# Patient Record
Sex: Female | Born: 1975
Health system: Southern US, Community
[De-identification: ages and names within clinical notes are randomized; demographics above are authoritative.]

## PROBLEM LIST (undated history)

## (undated) ENCOUNTER — Inpatient Hospital Stay (HOSPITAL_COMMUNITY): Payer: Self-pay

## (undated) DIAGNOSIS — R87629 Unspecified abnormal cytological findings in specimens from vagina: Secondary | ICD-10-CM

## (undated) HISTORY — PX: TUBAL LIGATION: SHX77

---

## 2003-05-10 ENCOUNTER — Inpatient Hospital Stay (HOSPITAL_COMMUNITY): Admission: AD | Admit: 2003-05-10 | Discharge: 2003-05-12 | Payer: Self-pay | Admitting: Obstetrics & Gynecology

## 2003-05-20 ENCOUNTER — Encounter: Admission: RE | Admit: 2003-05-20 | Discharge: 2003-05-20 | Payer: Self-pay | Admitting: *Deleted

## 2003-08-03 ENCOUNTER — Encounter: Admission: RE | Admit: 2003-08-03 | Discharge: 2003-08-03 | Payer: Self-pay | Admitting: *Deleted

## 2003-08-24 ENCOUNTER — Encounter: Admission: RE | Admit: 2003-08-24 | Discharge: 2003-08-24 | Payer: Self-pay | Admitting: *Deleted

## 2003-08-30 ENCOUNTER — Ambulatory Visit (HOSPITAL_COMMUNITY): Admission: RE | Admit: 2003-08-30 | Discharge: 2003-08-30 | Payer: Self-pay | Admitting: *Deleted

## 2003-09-07 ENCOUNTER — Encounter: Admission: RE | Admit: 2003-09-07 | Discharge: 2003-09-07 | Payer: Self-pay | Admitting: *Deleted

## 2003-10-12 ENCOUNTER — Inpatient Hospital Stay (HOSPITAL_COMMUNITY): Admission: AD | Admit: 2003-10-12 | Discharge: 2003-10-14 | Payer: Self-pay | Admitting: Obstetrics & Gynecology

## 2013-08-02 ENCOUNTER — Encounter (HOSPITAL_COMMUNITY): Payer: Self-pay | Admitting: *Deleted

## 2013-08-02 ENCOUNTER — Inpatient Hospital Stay (HOSPITAL_COMMUNITY): Payer: Medicaid Other

## 2013-08-02 ENCOUNTER — Inpatient Hospital Stay (HOSPITAL_COMMUNITY)
Admission: AD | Admit: 2013-08-02 | Discharge: 2013-08-02 | Disposition: A | Discharge status: Home / Self Care | Payer: Medicaid Other | Source: Ambulatory Visit | Attending: Obstetrics and Gynecology | Admitting: Obstetrics and Gynecology

## 2013-08-02 DIAGNOSIS — R109 Unspecified abdominal pain: Secondary | ICD-10-CM | POA: Insufficient documentation

## 2013-08-02 DIAGNOSIS — O209 Hemorrhage in early pregnancy, unspecified: Secondary | ICD-10-CM | POA: Insufficient documentation

## 2013-08-02 DIAGNOSIS — O200 Threatened abortion: Secondary | ICD-10-CM

## 2013-08-02 HISTORY — DX: Unspecified abnormal cytological findings in specimens from vagina: R87.629

## 2013-08-02 LAB — CBC
HCT: 34.8 % — ABNORMAL LOW (ref 36.0–46.0)
Hemoglobin: 11.9 g/dL — ABNORMAL LOW (ref 12.0–15.0)
MCH: 27.7 pg (ref 26.0–34.0)
MCHC: 34.2 g/dL (ref 30.0–36.0)
MCV: 81.1 fL (ref 78.0–100.0)
Platelets: 241 10*3/uL (ref 150–400)
RBC: 4.29 MIL/uL (ref 3.87–5.11)
RDW: 14.5 % (ref 11.5–15.5)
WBC: 8.1 10*3/uL (ref 4.0–10.5)

## 2013-08-02 LAB — URINALYSIS, ROUTINE W REFLEX MICROSCOPIC
Bilirubin Urine: NEGATIVE
Glucose, UA: NEGATIVE mg/dL
Ketones, ur: NEGATIVE mg/dL
Leukocytes, UA: NEGATIVE
Nitrite: NEGATIVE
Protein, ur: NEGATIVE mg/dL
Specific Gravity, Urine: <1.005 — ABNORMAL LOW (ref 1.005–1.030)
Urobilinogen, UA: 0.2 mg/dL (ref 0.0–1.0)
pH: 6 (ref 5.0–8.0)

## 2013-08-02 LAB — HCG, QUANTITATIVE, PREGNANCY: hCG, Beta Chain, Quant, S: 6362 m[IU]/mL — ABNORMAL HIGH (ref <5)

## 2013-08-02 LAB — URINE MICROSCOPIC-ADD ON

## 2013-08-02 LAB — WET PREP, GENITAL
Clue Cells Wet Prep HPF POC: NONE SEEN
Trich, Wet Prep: NONE SEEN
WBC, Wet Prep HPF POC: NONE SEEN
Yeast Wet Prep HPF POC: NONE SEEN

## 2013-08-02 LAB — POCT PREGNANCY, URINE: Preg Test, Ur: POSITIVE — AB

## 2013-08-02 NOTE — MAU Note (Signed)
Patient presents with complaints of intermittent vaginal bleeding for the last three days. Also having lower abdominal pain "like a period". LMP 11/28

## 2013-08-02 NOTE — MAU Provider Note (Signed)
History     CSN: [id number]  Arrival date and time: 08/02/13 2011   Chief Complaint  Patient presents with  . Vaginal Bleeding  . Abdominal Pain   HPI  38 yo G7P5105 @ [redacted]w[redacted]d by LMP (no [place] confirmation) who presents for vaginal bleeding and abdominal pain.  States has been bleeding for the last 1.5 weeks.  For the first week it was relatively light and she could wear a pantyliner but for the last 3 days it has gotten heavier. Yesterday she passed 2 small clots. Today it has just been blood.  Still not filling more than a pad a day.  Some intermittent cramping but very mild.  No fevers, chills, nausea, vomiting, dysuria, cp, sob.  +headache from time to time.    OB History   Grav Para Term Preterm Abortions TAB SAB Ect Mult Living   7 6 5 1      5       Past Medical History  Diagnosis Date  . Vaginal Pap smear, abnormal     History reviewed. No pertinent past surgical history.  History reviewed. No pertinent family history.  History  Substance Use Topics  . Smoking status: Never Smoker   . Smokeless tobacco: Not on file  . Alcohol Use: No    Allergies: No Known Allergies  Prescriptions prior to admission  Medication Sig Dispense Refill  . Prenatal Vit-Fe Fumarate-FA (MULTIVITAMIN-PRENATAL) 27-0.8 MG TABS tablet Take 1 tablet by mouth daily at 12 noon.        Review of Systems  All other systems reviewed and are negative.   Physical Exam   Blood pressure 138/56, pulse 89, temperature 99 F (37.2 C), temperature source Oral, resp. rate 18, height 4' 11.5" (1.511 m), weight 67.132 kg (148 lb), last menstrual period 05/15/2013, SpO2 100.00%.  Physical Exam  Constitutional: She appears well-developed and well-nourished.  HENT:  Head: Normocephalic and atraumatic.  Neck: Neck supple.  Cardiovascular: Normal rate, regular rhythm and normal heart sounds.   Respiratory: Effort normal and breath sounds normal.  GI: Soft. Bowel sounds are normal. She exhibits no  distension. There is no tenderness. There is no rebound and no guarding.  Genitourinary: There is no lesion on the right labia. There is no lesion on the left labia. Uterus is enlarged (approx 5-6week size). Uterus is not tender. Cervix exhibits no motion tenderness. Right adnexum displays no mass and no tenderness. Left adnexum displays no mass and no tenderness. There is bleeding (mild amount of dark red blood in the posterior fornix able to absorm most with a single faux swab. no active vleeding from the OS. OS appears closed.  ) around the vagina.  Musculoskeletal: She exhibits no edema.  Neurological: She is alert.  Skin: Skin is warm and dry.  Psychiatric: She has a normal mood and affect.    MAU Course  Procedures  MDM UPT + CBC- ABO/Rh: A+  HCG- 6362  [place] Large gestational sac with no yolk sac or embryo. Dates are  discordant. Findings are suspicious but not yet definitive for  failed pregnancy. Recommend follow-up US in 10-14 days for  definitive diagnosis.   Assessment and Plan   Exam and [place] most consistent with likely spontaneous abortion - results discussed with pt - A+ blood type - will have her f/u in 2 days for repeat quant given pt's disbelief in diagnosis - then likely follow weekly until resolved - consider [place] as needed  Return precautions discussed   Jeannie Mallinger,  Rosevelt Luu L 08/02/2013, 8:54 PM

## 2013-08-02 NOTE — Discharge Instructions (Signed)
Amenaza de aborto  (Threatened Miscarriage)  La hemorragia en las primeras 20 semanas de embarazo es algo frecuente. Se denomina amenaza de aborto Es un problema del embarazo que ocurre antes de la vigésima semana y que sugiere la probabilidad de que ocurra un aborto espontáneo. Generalmente esta hemorragia se detiene con reposo o con disminución de las actividades, según le ha sugerido el profesional que la asiste, y el embarazo continúa sin mayores problemas. Le indicarán que no mantenga relaciones sexuales, no tenga orgasmos ni use tampones hasta que la autoricen. En algunos casos la amenaza de aborto progresará hasta el aborto completo o incompleto. En algunos casos puede ser necesario un tratamiento adicional, en otros casos no. Algunos abortos ocurren antes que la mujer note que no ha menstruado y de que sepa que está embarazada.  Un aborto ocurre en el 15% al 20% de todos los embarazos y generalmente durante las primeras 13 semanas. En la mayoría de los casos se desconoce la causa exacta. Es una manera en que la naturaliza pone fin a un embarazo anormal o que no llegará a término. Algunas de las cosas que ponen en riesgo el embarazo son:  · Los cambios hormonales.  · Infección o tumores en el útero.  · Enfermedades crónicas, por ejemplo la diabetes, especialmente si no se ha controlado.  · Forma anormal del útero.  · Fibromas en el útero  · Cérvix incompetente (es demasiado débil para contener al bebé)  · El consumo de cigarrillos.  · Beber alcohol en exceso. Lo mejor es abstenerse de beber alcohol durante el embarazo.  · El consumo de drogas  TRATAMIENTO  No es necesario realizar un tratamiento adicional cuando el aborto es completo y todos los componentes de la concepción (todos los tejidos) se han eliminado. Si ha eliminado tejidos, consérvelos en un recipiente y llévelos al médico para que los evalúe. Si el aborto es incompleto (partes del feto o de la placenta quedan adentro) será necesario un  tratamiento adicional. La razón más frecuente para realizar un tratamiento es el sangrado (hemorragia) continuo debido a que los tejidos no se han eliminado completamente. Esto sucede cuando el aborto es incompleto. También puede suceder que los tejidos que no se han expulsado se infecten. El tratamiento consiste en la dilatación y curetaje (remoción de los productos del embarazo que pudieran quedar en el útero). Se realizará simplemente por medio de la succión (curetaje por succión) o por un raspado simple del interior del útero. Podrá llevarse a cabo en el hospital o en el consultorio del profesional. Sólo se lleva a cabo cuando el profesional se asegura que no hay posibilidades de que el embarazo llegue a término. Esto se determina por medio del examen físico, la prueba de embarazo negativa, el recuento hormonal y el ultrasonido que confirme la muerte del feto.  El aborto generalmente es una situación emocionalmente difícil para los padres. No es por su culpa ni la de su pareja. No ocurre por conductas inapropiadas por parte suya o de su compañero. Casi todos los abortos se producen porque el embarazo ha comenzado de un modo incorrecto. Al menos la mitad de estos embarazos presenta anormalidades cromosómicas. Casi nunca se trata de un trastorno congénito. En otros puede haber problemas de desarrollo en el feto o en la placenta. Esto no siempre se revela, aún cuando se estudien los productos del aborto con el microscopio. No se sienta culpable y probablemente no podría haber evitado que esto ocurra. Si tiene problemas emocionales debido a   este problema, convérselo con el profesional y solicite ayuda psicológico antes de un nuevo embarazo. Casi siempre puede tratar de embarazarse nuevamente tan pronto el profesional la autorice.  INSTRUCCIONES PARA EL CUIDADO DOMICILIARIO  · El profesional le indicará reposo, según la importancia de la hemorragia y los dolores que presente. Probablemente sólo la autorice a  levantarse para ir al baño. También podrá autorizarla a realizar una actividad ligera. En este momento usted necesitará hacer algunos arreglos para que otra persona se ocupe del cuidado de los niños y de otras responsabilidades adicionales.  · Lleve un registro de la cantidad y la saturación de las toallas higiénicas que utiliza cada día. Anote esta información.  · NO USE TAMPONES. No se haga duchas vaginales ni tenga relaciones sexuales u orgasmos hasta que el médico la autorice.  · Puede ser que le indiquen una cita para un seguimiento en el que volverán a evaluar el estado de su embarazo y le repetirán las pruebas de sangre. Concurra para una nueva evaluación dentro de 2 días y nuevamente dentro de 4 a 6 semanas. Es muy importante que realice el seguimiento en el momento que le han indicado.  · Si su grupo sanguíneo es Rh negativo y el del padre es Rh positivo, o si no conoce el grupo sanguíneo del padre, le indicarán una inyección (inmunoglobulina Rh) para prevenir los anticuerpos anormales que pueden desarrollarse y afectar al bebé en futuros embarazos.  SOLICITE ATENCIÓN MÉDICA DE INMEDIATO SI:  · Siente calambres intensos en el estómago, en la espalda o en el abdomen.  · Siente un dolor intenso de comienzo súbito en la zona inferior del abdomen.  · Comienza a sentir escalofríos.  · La temperatura se eleva por encima de 101° F (38.3° C).  · Elimina coágulos o tejidos grandes. Guarde una muestra de esos tejidos para que el profesional lo inspeccione.  · La hemorragia aumenta o se siente mareada, débil o tiene episodios de desmayos.  · Tiene una pérdida de líquido por la vagina.  · Se desmaya. No puede mover el intestino. Podría tratarse de un embarazo ectópico.  Document Released: 03/14/2005 Document Revised: 08/27/2011  ExitCare® Patient Information ©2014 [employer].

## 2013-08-03 LAB — GC/CHLAMYDIA PROBE AMP
CT Probe RNA: NEGATIVE
GC Probe RNA: NEGATIVE

## 2013-08-03 LAB — ABO/RH: ABO/RH(D): A POS

## 2013-08-03 NOTE — MAU Provider Note (Signed)
Attestation of Attending Supervision of Advanced Practitioner (CNM/NP): Evaluation and management procedures were performed by the Advanced Practitioner under my supervision and collaboration.  I have reviewed the Advanced Practitioner's note and chart, and I agree with the management and plan.  Hailley Byers 08/03/2013 7:54 AM   

## 2013-08-04 ENCOUNTER — Inpatient Hospital Stay (HOSPITAL_COMMUNITY)
Admission: AD | Admit: 2013-08-04 | Discharge: 2013-08-04 | Disposition: A | Discharge status: Home / Self Care | Payer: Self-pay | Source: Ambulatory Visit | Attending: Obstetrics & Gynecology | Admitting: Obstetrics & Gynecology

## 2013-08-04 DIAGNOSIS — O039 Complete or unspecified spontaneous abortion without complication: Secondary | ICD-10-CM | POA: Insufficient documentation

## 2013-08-04 LAB — HCG, QUANTITATIVE, PREGNANCY: hCG, Beta Chain, Quant, S: 5405 m[IU]/mL — ABNORMAL HIGH (ref <5)

## 2013-08-04 MED ORDER — HYDROCODONE-ACETAMINOPHEN 5-325 MG PO TABS: 1.0000 | ORAL_TABLET | Freq: Four times a day (QID) | ORAL | Status: DC | PRN

## 2013-08-04 NOTE — MAU Note (Signed)
Here for follow up BHCG, some bleeding this am, no pain

## 2013-08-04 NOTE — MAU Provider Note (Signed)
  History     CSN: [id number]  Arrival date and time: 08/04/13 1845   None     Chief Complaint  Patient presents with  . Follow-up   HPI  Jillian Carlson is a 38 y.o. [id number] at 7860w4d who presents today for FU HCG. She states that she has had some bleeding, but no pain at this time. US 2 days ago showed gestational sac, but no yolk sac or fetal pole.   Past Medical History  Diagnosis Date  . Vaginal Pap smear, abnormal     No past surgical history on file.  No family history on file.  History  Substance Use Topics  . Smoking status: Never Smoker   . Smokeless tobacco: Not on file  . Alcohol Use: No    Allergies: No Known Allergies  Prescriptions prior to admission  Medication Sig Dispense Refill  . Prenatal Vit-Fe Fumarate-FA (MULTIVITAMIN-PRENATAL) 27-0.8 MG TABS tablet Take 1 tablet by mouth daily at 12 noon.        ROS Physical Exam   Blood pressure 105/52, pulse 63, temperature 99 F (37.2 C), resp. rate 16, height 5' 0.5" (1.537 m), weight 66.679 kg (147 lb), last menstrual period 05/15/2013, SpO2 100.00%.  Physical Exam  Nursing note and vitals reviewed. Constitutional: She is oriented to person, place, and time. She appears well-developed and well-nourished. No distress.  Cardiovascular: Normal rate.   Respiratory: Effort normal.  GI: Soft.  Neurological: She is alert and oriented to person, place, and time.  Skin: Skin is warm and dry.  Psychiatric: She has a normal mood and affect.    MAU Course  Procedures  Results for Jillian Carlson, Jillian Carlson (MRN [id number]) as of 08/04/2013 19:46  Ref. Range 08/02/2013 20:45 08/02/2013 21:12 08/02/2013 22:04 08/04/2013 19:12  hCG, Beta Chain, Quant, S Latest Range: <5 mIU/mL 6362 (H)   5405 (H)    Assessment and Plan   1. SAB (spontaneous abortion)    Bleeding precautions Return to MAU as needed Repeat blood work in 2 days in the clinic  Follow-up Information   [facility]  [facility] days.   Specialty:  Obstetrics and Gynecology   Contact information:   [address] [place] [address] [phone]       Tawnya CrookHogan, Lu Paradise Donovan 08/04/2013, 7:46 PM

## 2013-08-04 NOTE — MAU Provider Note (Signed)
Attestation of Attending Supervision of Advanced Practitioner (CNM/NP): Evaluation and management procedures were performed by the Advanced Practitioner under my supervision and collaboration. I have reviewed the Advanced Practitioner's note and chart, and I agree with the management and plan.  Angelys Yetman H. 9:18 PM

## 2013-08-04 NOTE — Discharge Instructions (Signed)
Aborto espontáneo  °(Miscarriage) °El aborto espontáneo es la pérdida de un bebé que no ha nacido (feto) antes de la semana 20 del embarazo. La mayor parte de estos abortos ocurre en los primeros 3 meses. En algunos casos ocurre antes de que la mujer sepa que está embarazada. También se denomina "aborto espontáneo" o "pérdida prematura del embarazo". El aborto espontáneo puede ser una experiencia que afecte emocionalmente a la persona. Converse con su médico si tiene dudas, cómo es el proceso de duelo, y sobre planes futuros de embarazo.  °CAUSAS  °· Algunos problemas cromosómicos pueden hacer imposible que el bebé se desarrolle normalmente. Los problemas con los genes o cromosomas del bebé son generalmente el resultado de errores que se producen, por casualidad, cuando el embrión se divide y crece. Estos problemas no se heredan de los padres. °· Infección en el cuello del útero.   °· Problemas hormonales.   °· Problemas en el cuello del útero, como tener un útero incompetente. Esto ocurre cuando los tejidos no son lo suficientemente fuertes como para contener el embarazo.   °· Problemas del útero, como un útero con forma anormal, los fibromas o anormalidades congénitas.   °· Ciertas enfermedades crónicas.   °· No fume, no beba alcohol, ni consuma drogas.   °· Traumatismos   °A veces, la causa es desconocida.  °SÍNTOMAS  °· Sangrado o manchado vaginal, con o sin cólicos o dolor. °· Dolor o cólicos en el abdomen o en la cintura. °· Eliminación de líquido, tejidos o coágulos grandes por la vagina. °DIAGNÓSTICO  °El médico le hará un examen físico. También le indicará una ecografía para confirmar el aborto. Es posible que se realicen análisis de sangre.  °TRATAMIENTO  °· En algunos casos el tratamiento no es necesario, si se eliminan naturalmente todos los tejidos embrionarios que se encontraban en el útero. Si el feto o la placenta quedan dentro del útero (aborto incompleto), pueden infectarse, los tejidos que quedan  pueden infectarse y deben retirarse. Generalmente se realiza un procedimiento de dilatación y curetaje (D y C). Durante el procedimiento de dilatación y curetaje, el cuello del útero se abre (dilata) y se retira cualquier resto de tejido fetal o placentario del útero. °· Si hay una infección, le recetarán antibióticos. Podrán recetarle otros medicamentos para reducir el tamaño del útero (contraerlo) si hay una mucho sangrado. °· Si su sangre es Rh negativa y su bebé es Rh positivo, usted necesitará la inyección de inmunoglobulina Rh. Esta inyección protegerá a los futuros bebés de tener problemas de compatibilidad Rh en futuros embarazos. °INSTRUCCIONES PARA EL CUIDADO EN EL HOGAR  °· El médico le indicará reposo en cama o le permitirá realizar actividades livianas. Vuelva a la actividad lentamente o según las indicaciones de su médico. °· Pídale a alguien que la ayude con las responsabilidades familiares y del hogar durante este tiempo.   °· Lleve un registro de la cantidad y la saturación de las toallas higiénicas que utiliza cada día. Anote esta información   °· No use tampones. No No se haga duchas vaginales ni tenga relaciones sexuales hasta que el médico la autorice.   °· Sólo tome medicamentos de venta libre o recetados para calmar el dolor o el malestar, según las indicaciones de su médico.   °· No tome aspirina. La aspirina puede ocasionar hemorragias.   °· Concurra puntualmente a las citas de control con el médico.   °· Si usted o su pareja tienen dificultades con el duelo, hable con su médico para buscar la ayuda psicológica que los ayude a enfrentar la pérdida   del embarazo. Permítase el tiempo suficiente de duelo antes de quedar embarazada nuevamente.   °SOLICITE ATENCIÓN MÉDICA DE INMEDIATO SI:  °· Siente calambres intensos o dolor en la espalda o en el abdomen. °· Tiene fiebre. °· Elimina grandes coágulos de sangre (del tamaño de una nuez o más) o tejidos por la vagina. Guarde lo que ha eliminado para  que su médico lo examine.   °· La hemorragia aumenta.   °· Observa una secreción vaginal espesa y con mal olor. °· Se siente mareada, débil, o se desmaya.   °· Siente escalofríos.   °ASEGÚRESE DE QUE:  °· Comprende estas instrucciones. °· Controlará su enfermedad. °· Solicitará ayuda de inmediato si no mejora o si empeora. °Document Released: 03/14/2005 Document Revised: 09/29/2012 °ExitCare® Patient Information ©2014 [employer]. ° °

## 2013-08-06 ENCOUNTER — Other Ambulatory Visit: Payer: Medicaid Other

## 2013-08-06 ENCOUNTER — Other Ambulatory Visit: Payer: Self-pay | Admitting: Obstetrics & Gynecology

## 2013-08-06 DIAGNOSIS — O039 Complete or unspecified spontaneous abortion without complication: Secondary | ICD-10-CM

## 2013-08-06 DIAGNOSIS — O200 Threatened abortion: Secondary | ICD-10-CM

## 2013-08-06 LAB — HCG, QUANTITATIVE, PREGNANCY: hCG, Beta Chain, Quant, S: 5687 m[IU]/mL

## 2013-08-06 NOTE — Progress Notes (Signed)
Beta hcg is not rising appropriately.  Gestational sac was 24 mm last scan.  Most likely a failed pregnancy.  Will scan 2 weeks after last scan to get diagnosis.  Pt needs US scheduled and called with appt.

## 2013-08-07 NOTE — Addendum Note (Signed)
Addended by: Sherre LainASH, AMANDA A on: 08/07/2013 08:51 AM   Modules accepted: Orders

## 2013-08-07 NOTE — Progress Notes (Signed)
Ultrasound scheduled for 08/17/13 at 1130. Patient informed.

## 2013-08-17 ENCOUNTER — Encounter: Payer: Self-pay | Admitting: Obstetrics & Gynecology

## 2013-08-17 ENCOUNTER — Other Ambulatory Visit: Payer: Self-pay | Admitting: Obstetrics & Gynecology

## 2013-08-17 ENCOUNTER — Ambulatory Visit (HOSPITAL_COMMUNITY)
Admission: RE | Admit: 2013-08-17 | Discharge: 2013-08-17 | Disposition: A | Discharge status: Home / Self Care | Payer: Medicaid Other | Source: Ambulatory Visit | Attending: Obstetrics & Gynecology | Admitting: Obstetrics & Gynecology

## 2013-08-17 DIAGNOSIS — O208 Other hemorrhage in early pregnancy: Secondary | ICD-10-CM | POA: Insufficient documentation

## 2013-08-17 DIAGNOSIS — O200 Threatened abortion: Secondary | ICD-10-CM

## 2013-08-17 NOTE — Progress Notes (Unsigned)
Patient ID: Jillian PassySusana Carlson, female   DOB: 05/17/1976, 38 y.o.   MRN: [id number] Pt was seen in radiology today for evaluation of vaginal bleeding in pregnancy.  She was noted ot have a blighted ovum at 13 weeks.  She reports continued bleeding today which is not heavy. Pt ate cereal and coffee 1 /12- 2 hours prior to our discussion.  She denies current pain.  Counseled pt about treatment options including the fact that she could bleed extensively and have lots of pain at this GA if she opted for medical management.   Patient desires surgical management with dilation and curettage for blighted ovum at 13 weeks.   The risks of surgery were discussed in detail with the patient including but not limited to: bleeding which may require transfusion or reoperation; infection which may require prolonged hospitalization or re-hospitalization and antibiotic therapy; injury to bowel, bladder, ureters and major vessels or other surrounding organs; need for additional procedures including laparotomy; thromboembolic phenomenon, incisional problems and other postoperative or anesthesia complications.  Patient was told that the likelihood that her condition and symptoms will be treated effectively with this surgical management was very high; the postoperative expectations were also discussed in detail. The patient also understands the alternative treatment options which were discussed in full. All questions were answered.  Scheduled for 08/18/2013 at 1000. Pt instructed to arrive at 0830 NPO.  Entire visit was with the use of Jillian Carlson the spanish interpreter.  Jillian Carlson L. Harraway-Smith, M.D., Evern CoreFACOG

## 2013-08-18 ENCOUNTER — Encounter (HOSPITAL_COMMUNITY)
Admission: AD | Disposition: A | Discharge status: Home / Self Care | Payer: Self-pay | Source: Ambulatory Visit | Attending: Obstetrics and Gynecology

## 2013-08-18 ENCOUNTER — Encounter (HOSPITAL_COMMUNITY): Payer: Self-pay | Admitting: Certified Registered Nurse Anesthetist

## 2013-08-18 ENCOUNTER — Ambulatory Visit (HOSPITAL_COMMUNITY)
Admission: AD | Admit: 2013-08-18 | Discharge: 2013-08-18 | Disposition: A | Discharge status: Home / Self Care | Payer: Self-pay | Source: Ambulatory Visit | Attending: Obstetrics and Gynecology | Admitting: Obstetrics and Gynecology

## 2013-08-18 ENCOUNTER — Ambulatory Visit (HOSPITAL_COMMUNITY): Payer: Medicaid Other | Admitting: Certified Registered Nurse Anesthetist

## 2013-08-18 ENCOUNTER — Encounter (HOSPITAL_COMMUNITY): Payer: Self-pay | Admitting: *Deleted

## 2013-08-18 DIAGNOSIS — O021 Missed abortion: Secondary | ICD-10-CM | POA: Insufficient documentation

## 2013-08-18 HISTORY — PX: DILATION AND EVACUATION: SHX1459

## 2013-08-18 LAB — CBC
HCT: 36.4 % (ref 36.0–46.0)
Hemoglobin: 12.4 g/dL (ref 12.0–15.0)
MCH: 27.9 pg (ref 26.0–34.0)
MCHC: 34.1 g/dL (ref 30.0–36.0)
MCV: 81.8 fL (ref 78.0–100.0)
Platelets: 196 10*3/uL (ref 150–400)
RBC: 4.45 MIL/uL (ref 3.87–5.11)
RDW: 14.5 % (ref 11.5–15.5)
WBC: 5.8 10*3/uL (ref 4.0–10.5)

## 2013-08-18 SURGERY — DILATION AND EVACUATION, UTERUS
Anesthesia: Monitor Anesthesia Care | Site: Uterus

## 2013-08-18 MED ORDER — LACTATED RINGERS IV SOLN
INTRAVENOUS | Status: DC
Start: 2013-08-18 — End: 2013-08-18
  Administered 2013-08-18: 09:00:00 via INTRAVENOUS

## 2013-08-18 MED ORDER — ACETAMINOPHEN 160 MG/5ML PO SOLN: 325.0000 mg | ORAL | Status: DC | PRN

## 2013-08-18 MED ORDER — DEXAMETHASONE SODIUM PHOSPHATE 10 MG/ML IJ SOLN
INTRAMUSCULAR | Status: DC | PRN
  Administered 2013-08-18: 5 mg via INTRAVENOUS

## 2013-08-18 MED ORDER — LIDOCAINE HCL (CARDIAC) 20 MG/ML IV SOLN
INTRAVENOUS | Status: AC
  Filled 2013-08-18: qty 5

## 2013-08-18 MED ORDER — MIDAZOLAM HCL 2 MG/2ML IJ SOLN
INTRAMUSCULAR | Status: AC
  Filled 2013-08-18: qty 2

## 2013-08-18 MED ORDER — FENTANYL CITRATE 0.05 MG/ML IJ SOLN
INTRAMUSCULAR | Status: AC
  Filled 2013-08-18: qty 2

## 2013-08-18 MED ORDER — ACETAMINOPHEN 325 MG PO TABS
325.0000 mg | ORAL_TABLET | ORAL | Status: DC | PRN
  Administered 2013-08-18: 650 mg via ORAL

## 2013-08-18 MED ORDER — FENTANYL CITRATE 0.05 MG/ML IJ SOLN
INTRAMUSCULAR | Status: DC | PRN
  Administered 2013-08-18: 100 ug via INTRAVENOUS

## 2013-08-18 MED ORDER — CHLOROPROCAINE HCL 1 % IJ SOLN
INTRAMUSCULAR | Status: AC
  Filled 2013-08-18: qty 30

## 2013-08-18 MED ORDER — LIDOCAINE HCL (CARDIAC) 20 MG/ML IV SOLN
INTRAVENOUS | Status: DC | PRN
  Administered 2013-08-18: 40 mg via INTRAVENOUS

## 2013-08-18 MED ORDER — IBUPROFEN 600 MG PO TABS: 600.0000 mg | ORAL_TABLET | Freq: Four times a day (QID) | ORAL | Status: DC | PRN

## 2013-08-18 MED ORDER — KETOROLAC TROMETHAMINE 30 MG/ML IJ SOLN
INTRAMUSCULAR | Status: DC | PRN
  Administered 2013-08-18: 30 mg via INTRAVENOUS

## 2013-08-18 MED ORDER — ONDANSETRON HCL 4 MG/2ML IJ SOLN
INTRAMUSCULAR | Status: DC | PRN
  Administered 2013-08-18: 4 mg via INTRAVENOUS

## 2013-08-18 MED ORDER — MIDAZOLAM HCL 5 MG/5ML IJ SOLN
INTRAMUSCULAR | Status: DC | PRN
  Administered 2013-08-18: 2 mg via INTRAVENOUS

## 2013-08-18 MED ORDER — PROPOFOL 10 MG/ML IV EMUL
INTRAVENOUS | Status: AC
  Filled 2013-08-18: qty 20

## 2013-08-18 MED ORDER — CHLOROPROCAINE HCL 1 % IJ SOLN
INTRAMUSCULAR | Status: DC | PRN
  Administered 2013-08-18: 10 mL

## 2013-08-18 MED ORDER — PROPOFOL INFUSION 10 MG/ML OPTIME
INTRAVENOUS | Status: DC | PRN
  Administered 2013-08-18: 150 ug/kg/min via INTRAVENOUS

## 2013-08-18 MED ORDER — DEXAMETHASONE SODIUM PHOSPHATE 10 MG/ML IJ SOLN
INTRAMUSCULAR | Status: AC
  Filled 2013-08-18: qty 1

## 2013-08-18 MED ORDER — GLYCOPYRROLATE 0.2 MG/ML IJ SOLN
INTRAMUSCULAR | Status: DC | PRN
  Administered 2013-08-18: 0.2 mg via INTRAVENOUS

## 2013-08-18 MED ORDER — OXYCODONE-ACETAMINOPHEN 5-325 MG PO TABS: 1.0000 | ORAL_TABLET | ORAL | Status: DC | PRN

## 2013-08-18 MED ORDER — ACETAMINOPHEN 325 MG PO TABS
ORAL_TABLET | ORAL | Status: AC
  Administered 2013-08-18: 650 mg via ORAL
  Filled 2013-08-18: qty 2

## 2013-08-18 MED ORDER — GLYCOPYRROLATE 0.2 MG/ML IJ SOLN
INTRAMUSCULAR | Status: AC
  Filled 2013-08-18: qty 1

## 2013-08-18 MED ORDER — FENTANYL CITRATE 0.05 MG/ML IJ SOLN: 25.0000 ug | INTRAMUSCULAR | Status: DC | PRN

## 2013-08-18 MED ORDER — ONDANSETRON HCL 4 MG/2ML IJ SOLN
INTRAMUSCULAR | Status: AC
  Filled 2013-08-18: qty 2

## 2013-08-18 SURGICAL SUPPLY — 19 items
CATH ROBINSON RED A/P 16FR (CATHETERS) ×3 IMPLANT
DECANTER SPIKE VIAL GLASS SM (MISCELLANEOUS) ×3 IMPLANT
GLOVE BIOGEL PI IND STRL 6.5 (GLOVE) ×4 IMPLANT
GLOVE BIOGEL PI INDICATOR 6.5 (GLOVE) ×8
GLOVE ECLIPSE 6.0 STRL STRAW (GLOVE) ×6 IMPLANT
GLOVE SURG SS PI 6.0 STRL IVOR (GLOVE) ×3 IMPLANT
GLOVE SURG SS PI 7.0 STRL IVOR (GLOVE) ×9 IMPLANT
GOWN STRL REUS W/TWL LRG LVL3 (GOWN DISPOSABLE) ×9 IMPLANT
KIT BERKELEY 1ST TRIMESTER 3/8 (MISCELLANEOUS) ×3 IMPLANT
NEEDLE SPNL 22GX3.5 QUINCKE BK (NEEDLE) ×3 IMPLANT
NS IRRIG 1000ML POUR BTL (IV SOLUTION) ×3 IMPLANT
PACK VAGINAL MINOR WOMEN LF (CUSTOM PROCEDURE TRAY) ×3 IMPLANT
PAD OB MATERNITY 4.3X12.25 (PERSONAL CARE ITEMS) ×3 IMPLANT
PAD PREP 24X48 CUFFED NSTRL (MISCELLANEOUS) ×3 IMPLANT
SET BERKELEY SUCTION TUBING (SUCTIONS) ×3 IMPLANT
SLEEVE SURGEON STRL (DRAPES) ×3 IMPLANT
SYR CONTROL 10ML LL (SYRINGE) ×3 IMPLANT
TOWEL OR 17X24 6PK STRL BLUE (TOWEL DISPOSABLE) ×6 IMPLANT
VACURETTE 10 RIGID CVD (CANNULA) ×3 IMPLANT

## 2013-08-18 NOTE — H&P (Addendum)
Jillian Carlson is an 38 y.o. female G7P5105 at 13 weeks with blighted ovum presenting today for scheduled dilatation and evacuation. Patient reports some conitnued spotting but no cramping.  Pertinent Gynecological History: Menses: flow is moderate Contraception: none DES exposure: denies Blood transfusions: none Sexually transmitted diseases: no past history    Menstrual History:  Patient's last menstrual period was 05/15/2013.    Past Medical History  Diagnosis Date  . Vaginal Pap smear, abnormal     No past surgical history on file.  No family history on file.  Social History:  reports that she has never smoked. She does not have any smokeless tobacco history on file. She reports that she does not drink alcohol or use illicit drugs.  Allergies: No Known Allergies  Prescriptions prior to admission  Medication Sig Dispense Refill  . Prenatal Vit-Fe Fumarate-FA (MULTIVITAMIN-PRENATAL) 27-0.8 MG TABS tablet Take 1 tablet by mouth daily at 12 noon.        Review of Systems  All other systems reviewed and are negative.    Blood pressure 117/51, pulse 57, temperature 98.8 F (37.1 C), temperature source Oral, resp. rate 16, last menstrual period 05/15/2013, SpO2 100.00%. Physical Exam  GENERAL: Well-developed, well-nourished female in no acute distress.  HEENT: Normocephalic, atraumatic. Sclerae anicteric.  NECK: Supple. Normal thyroid.  LUNGS: Clear to auscultation bilaterally.  HEART: Regular rate and rhythm. ABDOMEN: Soft, nontender, nondistended. No organomegaly. PELVIC: Deferred to OR EXTREMITIES: No cyanosis, clubbing, or edema, 2+ distal pulses.  Results for orders placed during the hospital encounter of 08/18/13 (from the past 24 hour(s))  CBC     Status: None   Collection Time    08/18/13  8:55 AM      Result Value Ref Range   WBC 5.8  4.0 - 10.5 K/uL   RBC 4.45  3.87 - 5.11 MIL/uL   Hemoglobin 12.4  12.0 - 15.0 g/dL   HCT 40.936.4  81.136.0 - 91.446.0 %   MCV 81.8  78.0 - 100.0 fL   MCH 27.9  26.0 - 34.0 pg   MCHC 34.1  30.0 - 36.0 g/dL   RDW 78.214.5  95.611.5 - 21.315.5 %   Platelets 196  150 - 400 K/uL    [place] Ob Transvaginal  08/17/2013   CLINICAL DATA:  Vaginal bleeding.  EXAM: TRANSVAGINAL OB ULTRASOUND  TECHNIQUE: Transvaginal ultrasound was performed for complete evaluation of the gestation as well as the maternal uterus, adnexal regions, and pelvic cul-de-sac.  COMPARISON:  08/02/2013  FINDINGS: Intrauterine gestational sac: Visualized/ irregular in shape.  Yolk sac:  Not visualized  Embryo:  Not visualized  MSD: 31  mm   8 w   3  d  Maternal uterus/adnexae: Small to moderate subchorionic hemorrhage noted. Neither ovary directly visualized, however no adnexal mass identified. No evidence of free fluid.  IMPRESSION: Findings now meet definitive criteria for failed pregnancy. This follows SRU consensus guidelines: Diagnostic Criteria for Nonviable Pregnancy Early in the First Trimester. Macy Mis Engl J Med [phone];369:1443-51.   Electronically Signed   By: Myles RosenthalJohn  Stahl M.D.   On: 08/17/2013 12:24    Assessment/Plan: 38 yo G7P5105 with failed pregnancy here for surgical management with dilatation and evacuation - Risks, benefits and alternatives were explained including but not limited to risks of bleeding, infection, uterine perforation and damage to adjacent organs. Patient verbalized understanding and all questions were answered. Patient was counseled in the presence of a spanish interpreter, Eda Royal.  Rande Dario 08/18/2013, 9:22 AM

## 2013-08-18 NOTE — Transfer of Care (Signed)
Immediate Anesthesia Transfer of Care Note  Patient: Jillian Carlson  Procedure(s) Performed: Procedure(s): DILATATION AND EVACUATION (N/A)  Patient Location: PACU  Anesthesia Type:MAC  Level of Consciousness: awake, alert  and oriented  Airway & Oxygen Therapy: Patient Spontanous Breathing and Patient connected to nasal cannula oxygen  Post-op Assessment: Report given to PACU RN, Post -op Vital signs reviewed and stable and Patient moving all extremities X 4  Post vital signs: Reviewed and stable  Complications: No apparent anesthesia complications

## 2013-08-18 NOTE — Preoperative (Signed)
Beta Blockers   Reason not to administer Beta Blockers:Not Applicable 

## 2013-08-18 NOTE — Anesthesia Preprocedure Evaluation (Signed)
Anesthesia Evaluation  Patient identified by MRN, date of birth, ID band Patient awake    Reviewed: Allergy & Precautions, H&P , Patient's Chart, lab work & pertinent test results, reviewed documented beta blocker date and time   Airway Mallampati: II TM Distance: >3 FB Neck ROM: full    Dental no notable dental hx.    Pulmonary  breath sounds clear to auscultation  Pulmonary exam normal       Cardiovascular Rhythm:regular Rate:Normal     Neuro/Psych    GI/Hepatic   Endo/Other    Renal/GU      Musculoskeletal   Abdominal   Peds  Hematology   Anesthesia Other Findings   Reproductive/Obstetrics                           Anesthesia Physical Anesthesia Plan  ASA: II  Anesthesia Plan: MAC   Post-op Pain Management:    Induction: Intravenous  Airway Management Planned: LMA, Mask and Natural Airway  Additional Equipment:   Intra-op Plan:   Post-operative Plan:   Informed Consent: I have reviewed the patients History and Physical, chart, labs and discussed the procedure including the risks, benefits and alternatives for the proposed anesthesia with the patient or authorized representative who has indicated his/her understanding and acceptance.   Dental Advisory Given  Plan Discussed with: CRNA and Surgeon  Anesthesia Plan Comments: (Discussed sedation and potential to need to place airway or ETT if warranted by clinical changes intra-operatively. We will start procedure as MAC.)        Anesthesia Quick Evaluation  

## 2013-08-18 NOTE — Discharge Instructions (Signed)
Dilatación y curetaje o curetaje por aspiración - Cuidados posteriores  (Dilation and Curettage or Vacuum Curettage, Care After)  Siga estas instrucciones durante las próximas semanas. Estas indicaciones le proporcionan información general acerca de cómo deberá cuidarse después del procedimiento. El médico también podrá darle instrucciones más específicas. El tratamiento se ha planificado de acuerdo a las prácticas médicas actuales, pero a veces se producen problemas. Comuníquese con el médico si tiene algún problema o tiene dudas después del procedimiento.  QUÉ ESPERAR DESPUÉS DEL PROCEDIMIENTO  Después del procedimiento, es típico tener cólicos. Pueden durar entre 1 y 2 días después del procedimiento.  INSTRUCCIONES PARA EL CUIDADO EN EL HOGAR   · No conduzca vehículos por 24 horas.  · Espere una semana antes de retomar las actividades intensas.  · Tómese la temperatura dos veces al día, durante 4 días y regístrela. Si tiene fiebre, informe estas temperaturas a su médico.  · Evite permanecer de pie durante largos períodos.  · Evite levantar, tironear o empujar objetos pesados. No levante nada que pese más de 10 libras (4,5 kg).  · Sólo suba escaleras una o dos veces por día.  · Tome con frecuencia momentos de descanso.  · Puede retomar su dieta habitual.  · Beba suficiente líquido para mantener la orina clara o de color amarillo pálido.  · La función intestinal normal se restablecerá. Si está constipada podrá:    · Tomar un laxante suave con autorización de su médico.  · Agregar frutas y salvado a su dieta.  · Beber más líquidos.  · Tome duchas en lugar de baños de inversión hasta que el médico la autorice.  · No tome baños de inmersión ni practique natación hasta que el médico la autorice.  · Pídale a alguna persona que permanezca con usted durante las primeras 24 a 48 horas, especialmente si le han administrado anestesia general.  · No se haga duchas vaginales ni tenga relaciones sexuales durante las 6 semanas  siguientes a la cirugía.  · Tome sólo medicamentos de venta libre o recetados, según las indicaciones del médico. No tome aspirina. Puede ocasionar hemorragias.  · Concurra a las consultas de control con su médico según las indicaciones.  SOLICITE ATENCIÓN MÉDICA SI:   · Siente cólicos o el dolor no se alivia con la medicación.  · Siente dolor abdominal que no parece estar relacionado con el área en que sentía los cólicos y el dolor anteriormente.  · Observa una secreción vaginal con mal olor.  · Tiene una erupción.  · Tiene problemas con algún medicamento.  SOLICITE ATENCIÓN MÉDICA DE INMEDIATO SI:   · Tiene una hemorragia más abundante que un período menstrual normal.  · Tiene fiebre.  · Siente dolor en el pecho.  · Le falta el aire.  · Se siente mareada o sufre un desmayo.  · Se desmaya.  · Siente dolor en la zona de los breteles de los hombros.  · Tiene una hemorragia vaginal abundante, con o sin coágulos  Document Released: 03/14/2005 Document Revised: 03/25/2013  ExitCare® Patient Information ©2014 ExitCare, LLC.

## 2013-08-18 NOTE — Anesthesia Postprocedure Evaluation (Signed)
  Anesthesia Post-op Note  Anesthesia Post Note  Patient: Jillian Carlson  Procedure(s) Performed: Procedure(s) (LRB): DILATATION AND EVACUATION (N/A)  Anesthesia type: MAC  Patient location: PACU  Post pain: Pain level controlled  Post assessment: Post-op Vital signs reviewed  Last Vitals:  Filed Vitals:   08/18/13 1100  BP: 88/37  Pulse: 53  Temp: 36.6 C  Resp: 16    Post vital signs: Reviewed  Level of consciousness: sedated  Complications: No apparent anesthesia complications

## 2013-08-18 NOTE — Op Note (Signed)
Jillian Carlson PROCEDURE DATE: 08/18/2013  PREOPERATIVE DIAGNOSIS: 9 week missed abortion. POSTOPERATIVE DIAGNOSIS: The same. PROCEDURE:     Dilation and Evacuation. SURGEON:  Dr. Catalina AntiguaPeggy Abb Gobert  INDICATIONS: 38 y.o. [id number] MAB at [redacted] weeks gestation, needing surgical completion.  Risks of surgery were discussed with the patient including but not limited to: bleeding which may require transfusion; infection which may require antibiotics; injury to uterus or surrounding organs;need for additional procedures including laparotomy or laparoscopy; possibility of intrauterine scarring which may impair future fertility; and other postoperative/anesthesia complications. Written informed consent was obtained.    FINDINGS:  A 9 week size anteverted uterus, moderate amounts of products of conception, specimen sent to pathology.  ANESTHESIA:    Monitored intravenous sedation, paracervical block. INTRAVENOUS FLUIDS:  500 ml of LR ESTIMATED BLOOD LOSS:  Less than 20 ml. SPECIMENS:  Products of conception sent to pathology COMPLICATIONS:  None immediate.  PROCEDURE DETAILS:  The patient was then taken to the operating room where general anesthesia was administered and was found to be adequate.  After an adequate timeout was performed, she was placed in the dorsal lithotomy position and examined; then prepped and draped in the sterile manner.   Her bladder was catheterized for an unmeasured amount of clear, yellow urine. A vaginal speculum was then placed in the patient's vagina and a single tooth tenaculum was applied to the anterior lip of the cervix.  A paracervical block using 0.5% Marcaine was administered. The cervix was gently dilated to accommodate a 10 mm suction curette that was gently advanced to the uterine fundus.  The suction device was then activated and curette slowly rotated to clear the uterus of products of conception.  A sharp curettage was then performed to confirm complete emptying of  the uterus. There was minimal bleeding noted and the tenaculum removed with good hemostasis noted.   All instruments were removed from the patient's vagina. The patient tolerated the procedure well and was taken to the recovery area awake, and in stable condition.  The patient will be discharged to home as per PACU criteria.  Routine postoperative instructions given.  She was prescribed Percocet, and Ibuprofen.  She will follow up in the clinic in 2-3 weeks for postoperative evaluation.

## 2013-08-19 ENCOUNTER — Encounter (HOSPITAL_COMMUNITY): Payer: Self-pay | Admitting: Obstetrics and Gynecology

## 2013-09-03 ENCOUNTER — Ambulatory Visit (INDEPENDENT_AMBULATORY_CARE_PROVIDER_SITE_OTHER): Payer: Self-pay | Admitting: Family Medicine

## 2013-09-03 ENCOUNTER — Encounter: Payer: Self-pay | Admitting: Family Medicine

## 2013-09-03 VITALS — BP 111/72 | HR 62 | Temp 97.1°F | Wt 146.0 lb

## 2013-09-03 DIAGNOSIS — O021 Missed abortion: Secondary | ICD-10-CM

## 2013-09-03 NOTE — Assessment & Plan Note (Signed)
Doing well post op--f/u prn

## 2013-09-03 NOTE — Progress Notes (Signed)
D & E on 08/18/2013.

## 2013-09-03 NOTE — Patient Instructions (Signed)
Preparacin para el Embarazo Preparacin para Firefighterel embarazo (atencin pre-conceptual) al obtener el asesoramiento y la informacin de su mdico antes de quedar embarazada es [place]una buena idea. Le ayudar a usted y su beb tiene una mejor oportunidad de Warehouse managertener un Psychiatristembarazo seguro sano y entrega de su beb. Haga una cita con su mdico para hablar de su salud, mdicos, y los antecedentes familiares y la forma de prepararse antes de quedar embarazada. Su mdico le har un examen fsico completo y Neomia Dearuna prueba de Papanicolaou. Ellos querrn saber: Sobre usted, su cnyuge o pareja, y su historia mdica y gentica de su familia. Si usted est comiendo una dieta equilibrada y beber suficientes lquidos. Qu vitaminas y suplementos minerales que est tomando. Esto incluye tomar cido flico antes de quedar embarazada para ayudar a prevenir defectos de nacimiento. Qu medicamentos est tomando, incluyendo los Marionmedicamentos, de venta Hanoverlibre y los medicamentos a base de hierbas. Si hay algn abuso de sustancias como el alcohol, el tabaco y las drogas [address] Streetilegales. Si hay alguna violencia domstica fsica o mental. Si existe algn riesgo de enfermedades de transmisin sexual entre usted y su pareja. Qu vacunas y las vacunas que ha tenido y lo que es posible que necesite antes de quedar embarazada. Si debe hacerse la prueba de deteccin del VIH. Si hay alguna exposicin a sustancias qumicas o txicas en el hogar o el trabajo. Si hay problemas mdicos que tiene esa necesidad de ser tratado y General Motorsmantenido bajo control antes de quedar embarazada, como la diabetes, presin arterial alta u otros. Si haba alguna cirugas anteriores, embarazos y problemas con ellos. Lo que su peso actual es ya fij una meta de cunto peso debe aumentar durante el Galvaembarazo. Adems, Zenaida Niecevan a comprobar si debe perder o ganar peso antes de quedar embarazada. Cul es su rutina de ejercicios y lo que es seguro durante el Psychiatristembarazo. Si hay algunos problemas  fsicos que deben ser abordados. Acerca de espaciar sus embarazos cuando hay otros nios. Si hay un problema financiero que pueden afectarle tener un hijo. Despus de Dow Chemicalhablar sobre los puntos anteriores con su mdico, su mdico le dar consejos sobre cmo ayudar a tratar y trabajar con usted en la resolucin de cualquier problema, si es necesario, antes de [place]iquedar embarazada. El objetivo es tener un embarazo saludable y seguro para usted y su beb. Usted debe mantener un registro exacto de sus perodos menstruales ya que le ayudar en la determinacin de la fecha de [place] Riverparto. Las vacunas que debes tener antes de quedar embarazada: Sarampin regulares, el sarampin alemn (rubola) y las paperas. El ttanos y la difteria. La varicela, si no es inmune. Herpes zoster (varicela) si no es inmune. La vacuna del papiloma humano (VPH) entre las edades de 9 y 26 aos de edad. Vacuna contra la hepatitis A. Vacuna contra la hepatitis B. Vacuna contra la gripe. La vacuna antineumoccica (neumona). Usted debe Location managerevitar el embarazo durante un mes despus de ser vacunados con una vacuna de virus vivos, como sarampin alemn (rubola), que se encuentra en la vacuna triple vrica (sarampin, paperas y [place] Islandsrubola). Otras vacunas pueden ser necesarias dependiendo de donde usted vive, como la malaria. Consulte con su mdico si se necesitan otras vacunas para usted. INSTRUCCIONES PARA EL CUIDADO DOMICILIARIO Siga el consejo de su mdico. Antes de quedar embarazada: Comience a tomar vitaminas, suplementos y 0,4 miligramos de cido flico al da. Consiga sus vacunas hasta a la fecha. Obtenga ayuda de un consejero de nutricin si usted no entiende lo que  una dieta equilibrada es, necesita ayuda con una dieta mdica especial o si necesita ayuda para perder o ganar peso. Comience a hacer ejercicio. Deje de fumar, el consumo de drogas ilegales, y el consumo de bebidas alcohlicas. Obtener asesoramiento si hay y el tipo de  violencia domstica. Hgase un examen de enfermedades de transmisin sexual, incluido el VIH. Obtener cualquier problema mdico bajo control (diabetes, presin arterial alta, convulsiones, asma u otros). Resuelva los problemas financieros o crear un plan para hacerlo. Asegrese de que usted y su cnyuge o pareja est lista para tener un beb. Mantenga un registro exacto de sus perodos menstruales. Documento de lanzamiento: 05/17/2008 Documento Revisado: 03/25/2013 Documento de Resea: 05/17/2008 ExitCare Informacin del Paciente  [address].

## 2013-09-03 NOTE — Progress Notes (Signed)
    Subjective:    Patient ID: Jillian Carlson is a 38 y.o. female presenting with Routine Post Op  on 09/03/2013  HPI: S/p D and E for missed Ab.  No longer bleeding.  Feels well.  Would like to get pregnant again soon. Feels sad, is tearful.  Review of Systems  Constitutional: Negative for fever and chills.  Respiratory: Negative for shortness of breath.   Cardiovascular: Negative for chest pain.  Gastrointestinal: Negative for nausea, vomiting and abdominal pain.  Genitourinary: Negative for dysuria.  Skin: Negative for rash.      Objective:    BP 111/72  Pulse 62  Temp(Src) 97.1 F (36.2 C)  Wt 146 lb (66.225 kg)  LMP 05/15/2013 Physical Exam  Constitutional: She is oriented to person, place, and time. She appears well-developed and well-nourished. No distress.  HENT:  Head: Normocephalic and atraumatic.  Eyes: No scleral icterus.  Neck: Neck supple.  Cardiovascular: Normal rate.   Pulmonary/Chest: Effort normal.  Abdominal: Soft.  Neurological: She is alert and oriented to person, place, and time.  Skin: Skin is warm and dry.  Psychiatric: She has a normal mood and affect.        Assessment & Plan:   Missed abortion Doing well post op--f/u prn   Preconception counseling done. Grief reaction is normal--reassurance given. Return if symptoms worsen or fail to improve.

## 2013-11-27 ENCOUNTER — Encounter (HOSPITAL_COMMUNITY): Payer: Self-pay | Admitting: *Deleted

## 2013-11-27 ENCOUNTER — Inpatient Hospital Stay (HOSPITAL_COMMUNITY)
Admission: AD | Admit: 2013-11-27 | Discharge: 2013-11-27 | Disposition: A | Discharge status: Home / Self Care | Payer: Self-pay | Source: Ambulatory Visit | Attending: Obstetrics & Gynecology | Admitting: Obstetrics & Gynecology

## 2013-11-27 ENCOUNTER — Inpatient Hospital Stay (HOSPITAL_COMMUNITY): Payer: Medicaid Other

## 2013-11-27 DIAGNOSIS — O468X1 Other antepartum hemorrhage, first trimester: Secondary | ICD-10-CM

## 2013-11-27 DIAGNOSIS — O208 Other hemorrhage in early pregnancy: Secondary | ICD-10-CM | POA: Insufficient documentation

## 2013-11-27 DIAGNOSIS — O418X10 Other specified disorders of amniotic fluid and membranes, first trimester, not applicable or unspecified: Secondary | ICD-10-CM

## 2013-11-27 DIAGNOSIS — O43899 Other placental disorders, unspecified trimester: Secondary | ICD-10-CM

## 2013-11-27 DIAGNOSIS — R109 Unspecified abdominal pain: Secondary | ICD-10-CM | POA: Insufficient documentation

## 2013-11-27 LAB — URINALYSIS, ROUTINE W REFLEX MICROSCOPIC
Bilirubin Urine: NEGATIVE
GLUCOSE, UA: NEGATIVE mg/dL
Ketones, ur: NEGATIVE mg/dL
LEUKOCYTES UA: NEGATIVE
Nitrite: NEGATIVE
PROTEIN: NEGATIVE mg/dL
Urobilinogen, UA: 0.2 mg/dL (ref 0.0–1.0)
pH: 6 (ref 5.0–8.0)

## 2013-11-27 LAB — URINE MICROSCOPIC-ADD ON

## 2013-11-27 LAB — CBC
HCT: 33.1 % — ABNORMAL LOW (ref 36.0–46.0)
HEMOGLOBIN: 11.6 g/dL — AB (ref 12.0–15.0)
MCH: 29.8 pg (ref 26.0–34.0)
MCHC: 35 g/dL (ref 30.0–36.0)
MCV: 85.1 fL (ref 78.0–100.0)
Platelets: 211 10*3/uL (ref 150–400)
RBC: 3.89 MIL/uL (ref 3.87–5.11)
RDW: 13.6 % (ref 11.5–15.5)
WBC: 10 10*3/uL (ref 4.0–10.5)

## 2013-11-27 LAB — WET PREP, GENITAL
Clue Cells Wet Prep HPF POC: NONE SEEN
Trich, Wet Prep: NONE SEEN
WBC, Wet Prep HPF POC: NONE SEEN
Yeast Wet Prep HPF POC: NONE SEEN

## 2013-11-27 LAB — HCG, QUANTITATIVE, PREGNANCY: hCG, Beta Chain, Quant, S: 209819 m[IU]/mL — ABNORMAL HIGH (ref <5)

## 2013-11-27 LAB — POCT PREGNANCY, URINE: Preg Test, Ur: POSITIVE — AB

## 2013-11-27 NOTE — MAU Provider Note (Signed)
History     CSN: [id number]  Arrival date and time: 11/27/13 [id number]   First Provider Initiated Contact with Patient 11/27/13 2015      Chief Complaint  Patient presents with  . Abdominal Pain  . Vaginal Bleeding   HPI Ms. Jillian Carlson is a 38 y.o. [id number] at 1239w3d by LMP who presents to MAU today with complaint of vaginal bleeding and lower abdominal pain. The patient states that she had 2 episodes of light bleeding prior to today. Today she states that the bleeding was heavier and she has had mild lower abdominal pain. She has been passing small clots. She has had some nausea without vomiting or diarrhea. She denies fever.   OB History   Grav Para Term Preterm Abortions TAB SAB Ect Mult Living   8 6 5 1 1  1   5       Past Medical History  Diagnosis Date  . Vaginal Pap smear, abnormal     Past Surgical History  Procedure Laterality Date  . Dilation and evacuation N/A 08/18/2013    Procedure: DILATATION AND EVACUATION;  Surgeon: Catalina AntiguaPeggy Constant, MD;  Location: [facility];  Service: Gynecology;  Laterality: N/A;    History reviewed. No pertinent family history.  History  Substance Use Topics  . Smoking status: Never Smoker   . Smokeless tobacco: Not on file  . Alcohol Use: No    Allergies: No Known Allergies  Prescriptions prior to admission  Medication Sig Dispense Refill  . ibuprofen (ADVIL,MOTRIN) 600 MG tablet Take 1 tablet (600 mg total) by mouth every 6 (six) hours as needed.  30 tablet  1  . oxyCODONE-acetaminophen (PERCOCET/ROXICET) 5-325 MG per tablet Take 1 tablet by mouth every 4 (four) hours as needed.  20 tablet  0  . Prenatal Vit-Fe Fumarate-FA (MULTIVITAMIN-PRENATAL) 27-0.8 MG TABS tablet Take 1 tablet by mouth daily at 12 noon.        Review of Systems  Constitutional: Positive for malaise/fatigue. Negative for fever.  Gastrointestinal: Positive for nausea and abdominal pain. Negative for vomiting, diarrhea and constipation.  Genitourinary:       +  vaginal bleeding  Neurological: Negative for dizziness, loss of consciousness and weakness.   Physical Exam   Blood pressure 114/56, pulse 65, temperature 98.8 F (37.1 C), resp. rate 18, height 4' 11.5" (1.511 m), weight 154 lb 12.8 oz (70.217 kg), last menstrual period 09/29/2013.  Physical Exam  Constitutional: She is oriented to person, place, and time. She appears well-developed and well-nourished. No distress.  HENT:  Head: Normocephalic and atraumatic.  Cardiovascular: Normal rate, regular rhythm and normal heart sounds.   Respiratory: Effort normal and breath sounds normal. No respiratory distress.  GI: Soft. Bowel sounds are normal. She exhibits no distension and no mass. There is tenderness (moderate tenderness to palpation of the lower abdomen). There is guarding. There is no rebound.  Genitourinary: Uterus is tender. Uterus is not enlarged. Cervix exhibits no motion tenderness, no discharge and no friability. Right adnexum displays tenderness. Right adnexum displays no mass. Left adnexum displays no mass and no tenderness. There is bleeding (small amount of blood in the vaginal vault) around the vagina. No vaginal discharge found.  Cervix: closed, thick  Neurological: She is alert and oriented to person, place, and time.  Skin: Skin is warm and dry. No erythema.  Psychiatric: She has a normal mood and affect.   Results for orders placed during the hospital encounter of 11/27/13 (from the past 24 hour(s))  URINALYSIS, ROUTINE W REFLEX MICROSCOPIC     Status: Abnormal   Collection Time    11/27/13  7:54 PM      Result Value Ref Range   Color, Urine YELLOW  YELLOW   APPearance CLEAR  CLEAR   Specific Gravity, Urine <1.005 (*) 1.005 - 1.030   pH 6.0  5.0 - 8.0   Glucose, UA NEGATIVE  NEGATIVE mg/dL   Hgb urine dipstick LARGE (*) NEGATIVE   Bilirubin Urine NEGATIVE  NEGATIVE   Ketones, ur NEGATIVE  NEGATIVE mg/dL   Protein, ur NEGATIVE  NEGATIVE mg/dL   Urobilinogen, UA 0.2   0.0 - 1.0 mg/dL   Nitrite NEGATIVE  NEGATIVE   Leukocytes, UA NEGATIVE  NEGATIVE  URINE MICROSCOPIC-ADD ON     Status: Abnormal   Collection Time    11/27/13  7:54 PM      Result Value Ref Range   Squamous Epithelial / LPF FEW (*) RARE   WBC, UA 3-6  <3 WBC/hpf   RBC / HPF 11-20  <3 RBC/hpf   Bacteria, UA FEW (*) RARE  POCT PREGNANCY, URINE     Status: Abnormal   Collection Time    11/27/13  8:02 PM      Result Value Ref Range   Preg Test, Ur POSITIVE (*) NEGATIVE  WET PREP, GENITAL     Status: None   Collection Time    11/27/13  8:30 PM      Result Value Ref Range   Yeast Wet Prep HPF POC NONE SEEN  NONE SEEN   Trich, Wet Prep NONE SEEN  NONE SEEN   Clue Cells Wet Prep HPF POC NONE SEEN  NONE SEEN   WBC, Wet Prep HPF POC NONE SEEN  NONE SEEN  CBC     Status: Abnormal   Collection Time    11/27/13  8:45 PM      Result Value Ref Range   WBC 10.0  4.0 - 10.5 K/uL   RBC 3.89  3.87 - 5.11 MIL/uL   Hemoglobin 11.6 (*) 12.0 - 15.0 g/dL   HCT 16.1 (*) 09.6 - 04.5 %   MCV 85.1  78.0 - 100.0 fL   MCH 29.8  26.0 - 34.0 pg   MCHC 35.0  30.0 - 36.0 g/dL   RDW 40.9  81.1 - 91.4 %   Platelets 211  150 - 400 K/uL  HCG, QUANTITATIVE, PREGNANCY     Status: Abnormal   Collection Time    11/27/13  8:45 PM      Result Value Ref Range   hCG, Beta Francene Finders [id number] (*) <5 mIU/mL   US Ob Comp Less 14 Wks  11/27/2013   CLINICAL DATA:  Intermittent bleeding for 3 days. Pain. First-trimester pregnancy.  EXAM: OBSTETRIC <14 WK ULTRASOUND  TECHNIQUE: Transabdominal ultrasound was performed for evaluation of the gestation as well as the maternal uterus and adnexal regions.  COMPARISON:  None for this pregnancy.  FINDINGS: Intrauterine gestational sac: Normal in shape.  Yolk sac:  Present  Embryo:  Present  Cardiac Activity: Present  Heart Rate: 172 bpm  CRL:   16.2  mm   8 w 1 d                  [place] EDC: 07/08/2014  Maternal uterus/adnexae: A large subchorionic hemorrhage measures 6.8 x 4.1 x 2.9  cm and covers nearly 50% of the circumference of the gestational sac. The adnexa are unremarkable.  IMPRESSION:  1. Single intrauterine pregnancy with an estimated gestational age of [redacted] weeks and 1 day. 2. Large subchorionic hemorrhage as described. This encompasses nearly 50% of the circumference of the gestational sac.   Electronically Signed   By: Gennette Pachris  Mattern M.D.   On: 11/27/2013 22:19   [place] Ob Transvaginal  11/27/2013   CLINICAL DATA:  Intermittent bleeding for 3 days. Pain. First-trimester pregnancy.  EXAM: OBSTETRIC <14 WK ULTRASOUND  TECHNIQUE: Transabdominal ultrasound was performed for evaluation of the gestation as well as the maternal uterus and adnexal regions.  COMPARISON:  None for this pregnancy.  FINDINGS: Intrauterine gestational sac: Normal in shape.  Yolk sac:  Present  Embryo:  Present  Cardiac Activity: Present  Heart Rate: 172 bpm  CRL:   16.2  mm   8 w 1 d                  [place] EDC: 07/08/2014  Maternal uterus/adnexae: A large subchorionic hemorrhage measures 6.8 x 4.1 x 2.9 cm and covers nearly 50% of the circumference of the gestational sac. The adnexa are unremarkable.  IMPRESSION: 1. Single intrauterine pregnancy with an estimated gestational age of [redacted] weeks and 1 day. 2. Large subchorionic hemorrhage as described. This encompasses nearly 50% of the circumference of the gestational sac.   Electronically Signed   By: Gennette Pachris  Mattern M.D.   On: 11/27/2013 22:19     MAU Course  Procedures None  MDM +UPT UA, wet prep, GC/Chlamydia, CBC, quant hCG and US today  Assessment and Plan  A: SIUP at 234w1d Large subchorionic hemorrhage  P: Discharge home  Bleeding precautions discussed Patient advised to take Tylenol PRN pain Patient given pregnancy confirmation letter and contact information for [facility] for prenatal care Patient may return to MAU as needed or if her condition were to change or worsen  Freddi StarrJulie N Ethier, PA-C  11/27/2013, 10:40 PM

## 2013-11-27 NOTE — Discharge Instructions (Signed)
Hematoma subcorinico (Subchorionic Hematoma) Un hematoma subcorinico es una acumulacin de sangre entre la pared externa de la placenta y la pared interna del la matriz (tero). La placenta es el rgano que conecta el feto a la pared del tero. La placenta realiza la funcin de alimentacin, respiracin (oxgeno al feto) y el trabajo de eliminacin de desechos (excrecin) del feto.  Un hematoma subcorinico es la anormalidad ms frecuente encontrada en una ecografa durante el primer trimestre o principios del segundo trimestre del embarazo. Si ha habido poca o ninguna hemorragia vaginal, generalmente los pequeos hematomas se reducen por su propia cuenta y no afectan al beb ni al Vanetta Muldersembarazo. La sangre es absorbida gradualmente durante una o Little Ch[place]os semanas. Cuando la hemorragia comienza ms tarde en el embarazo o el hematoma es ms grande o se produce en una paciente de edad avanzada, el resultado puede no ser tan bueno. Los grandes hematomas pueden agrandarse an ms y Lesothoaumenta las posibilidades de aborto espontneo. El hematoma subcorinico tambin aumenta el riesgo de desprendimiento precoz de la placenta del tero, muerte fetal y Sport and exercise psychologistparto prematuro. INSTRUCCIONES PARA EL CUIDADO EN EL HOGAR   Repose en cama si el mdico se lo recomienda. Aunque el reposo en cama no evitar la hemorragia o un aborto espontneo, su mdico puede recomendarlo.  Evite levantar objetos pesados (ms de 10 libras [4,5 kg]), hacer ejercicio, tener relaciones sexuales o realizar duchas vaginales segn se lo indique el profesional.  Lleve un registro de la cantidad y Hydrographic surveyorgrado de remojo (saturacin) de las toallas higinicas que Landscape architectutiliza cada da. Anote esta informacin.  No use tampones.  Cumpla con todas las visitas de control, segn le indique su mdico. El profesional podr pedirle que se realice anlisis de seguimiento, pruebas de Winterhavenultrasonido o [place]ambas. SOLICITE ATENCIN MDICA DE INMEDIATO SI:   Siente calambres intensos en el  estmago, en la espalda, en el abdomen o en la pelvis.  Tiene fiebre.  Elimina cogulos o tejidos grandes. Guarde los tejidos para que su mdico los vea.  Si la hemorragia aumenta o siente mareos, debilidad o tiene episodios de Gansdesmayos. Document Released: 09/20/2008 Document Revised: 03/25/2013 [facility] Patient Information 2014 [employer]Exit[employer], [place].

## 2013-11-27 NOTE — MAU Note (Addendum)
Having abd pain and vag bleeding. On June 9th had pos preg test and I had some bleeding that day but it stopped. Today the bleeding started again. Small amt bleeding that is sometimes red and sometimes pink. Abd pain started today. Pt had D&C 3/3 for failed pregnancy.

## 2013-11-28 LAB — GC/CHLAMYDIA PROBE AMP
CT Probe RNA: NEGATIVE
GC PROBE AMP APTIMA: NEGATIVE

## 2013-11-28 LAB — HIV ANTIBODY (ROUTINE TESTING W REFLEX): HIV: NONREACTIVE

## 2013-11-28 NOTE — MAU Provider Note (Signed)
Attestation of Attending Supervision of Advanced Practitioner (PA/CNM/NP): Evaluation and management procedures were performed by the Advanced Practitioner under my supervision and collaboration.  I have reviewed the Advanced Practitioner's note and chart, and I agree with the management and plan.  Merla Sawka, MD, FACOG Attending Obstetrician & Gynecologist Faculty Practice, [facility]  

## 2014-01-21 ENCOUNTER — Other Ambulatory Visit (HOSPITAL_COMMUNITY): Payer: Self-pay | Admitting: Nurse Practitioner

## 2014-01-21 DIAGNOSIS — Z3689 Encounter for other specified antenatal screening: Secondary | ICD-10-CM

## 2014-01-21 LAB — OB RESULTS CONSOLE HGB/HCT, BLOOD
HCT: 35 %
Hemoglobin: 11.6 g/dL

## 2014-01-21 LAB — OB RESULTS CONSOLE VARICELLA ZOSTER ANTIBODY, IGG: VARICELLA IGG: IMMUNE

## 2014-01-21 LAB — OB RESULTS CONSOLE GC/CHLAMYDIA
CHLAMYDIA, DNA PROBE: NEGATIVE
Chlamydia: NEGATIVE
GC PROBE AMP, GENITAL: NEGATIVE
Gonorrhea: NEGATIVE

## 2014-01-21 LAB — OB RESULTS CONSOLE HEPATITIS B SURFACE ANTIGEN: HEP B S AG: NEGATIVE

## 2014-01-21 LAB — OB RESULTS CONSOLE HIV ANTIBODY (ROUTINE TESTING): HIV: NONREACTIVE

## 2014-01-21 LAB — OB RESULTS CONSOLE PLATELET COUNT: PLATELETS: 217 10*3/uL

## 2014-01-21 LAB — OB RESULTS CONSOLE RPR
RPR: NONREACTIVE
RPR: NONREACTIVE

## 2014-01-21 LAB — OB RESULTS CONSOLE RUBELLA ANTIBODY, IGM
Rubella: IMMUNE
Rubella: IMMUNE

## 2014-02-09 ENCOUNTER — Other Ambulatory Visit (HOSPITAL_COMMUNITY): Payer: Self-pay | Admitting: Nurse Practitioner

## 2014-02-09 ENCOUNTER — Ambulatory Visit (HOSPITAL_COMMUNITY)
Admission: RE | Admit: 2014-02-09 | Discharge: 2014-02-09 | Disposition: A | Discharge status: Home / Self Care | Payer: Medicaid Other | Source: Ambulatory Visit | Attending: Nurse Practitioner | Admitting: Nurse Practitioner

## 2014-02-09 DIAGNOSIS — O09529 Supervision of elderly multigravida, unspecified trimester: Secondary | ICD-10-CM | POA: Diagnosis not present

## 2014-02-09 DIAGNOSIS — Z3689 Encounter for other specified antenatal screening: Secondary | ICD-10-CM

## 2014-02-09 DIAGNOSIS — IMO0002 Reserved for concepts with insufficient information to code with codable children: Secondary | ICD-10-CM

## 2014-02-17 ENCOUNTER — Ambulatory Visit (HOSPITAL_COMMUNITY): Payer: MEDICAID

## 2014-02-18 ENCOUNTER — Encounter (HOSPITAL_COMMUNITY): Payer: Self-pay

## 2014-02-19 ENCOUNTER — Other Ambulatory Visit (HOSPITAL_COMMUNITY): Payer: Self-pay | Admitting: Nurse Practitioner

## 2014-02-19 DIAGNOSIS — Z0372 Encounter for suspected placental problem ruled out: Secondary | ICD-10-CM

## 2014-03-02 ENCOUNTER — Inpatient Hospital Stay (HOSPITAL_COMMUNITY): Payer: Medicaid Other

## 2014-03-02 ENCOUNTER — Inpatient Hospital Stay (HOSPITAL_COMMUNITY)
Admission: AD | Admit: 2014-03-02 | Discharge: 2014-03-02 | Disposition: A | Discharge status: Home / Self Care | Payer: Medicaid Other | Source: Ambulatory Visit | Attending: Family Medicine | Admitting: Family Medicine

## 2014-03-02 ENCOUNTER — Encounter (HOSPITAL_COMMUNITY): Payer: Self-pay | Admitting: *Deleted

## 2014-03-02 DIAGNOSIS — O4702 False labor before 37 completed weeks of gestation, second trimester: Secondary | ICD-10-CM

## 2014-03-02 DIAGNOSIS — O4410 Placenta previa with hemorrhage, unspecified trimester: Secondary | ICD-10-CM

## 2014-03-02 DIAGNOSIS — O4412 Placenta previa with hemorrhage, second trimester: Secondary | ICD-10-CM

## 2014-03-02 DIAGNOSIS — O209 Hemorrhage in early pregnancy, unspecified: Secondary | ICD-10-CM | POA: Insufficient documentation

## 2014-03-02 LAB — URINALYSIS, ROUTINE W REFLEX MICROSCOPIC
Bilirubin Urine: NEGATIVE
GLUCOSE, UA: NEGATIVE mg/dL
KETONES UR: NEGATIVE mg/dL
NITRITE: NEGATIVE
PH: 6 (ref 5.0–8.0)
Protein, ur: NEGATIVE mg/dL
Specific Gravity, Urine: 1.015 (ref 1.005–1.030)
Urobilinogen, UA: 0.2 mg/dL (ref 0.0–1.0)

## 2014-03-02 LAB — URINE MICROSCOPIC-ADD ON

## 2014-03-02 LAB — CBC
HCT: 29.2 % — ABNORMAL LOW (ref 36.0–46.0)
Hemoglobin: 10.1 g/dL — ABNORMAL LOW (ref 12.0–15.0)
MCH: 30.8 pg (ref 26.0–34.0)
MCHC: 34.6 g/dL (ref 30.0–36.0)
MCV: 89 fL (ref 78.0–100.0)
Platelets: 202 10*3/uL (ref 150–400)
RBC: 3.28 MIL/uL — ABNORMAL LOW (ref 3.87–5.11)
RDW: 13.7 % (ref 11.5–15.5)
WBC: 10.2 10*3/uL (ref 4.0–10.5)

## 2014-03-02 MED ORDER — INDOMETHACIN 25 MG PO CAPS
50.0000 mg | ORAL_CAPSULE | Freq: Once | ORAL | Status: AC
  Administered 2014-03-02: 50 mg via ORAL
  Filled 2014-03-02: qty 2

## 2014-03-02 MED ORDER — INDOMETHACIN 25 MG PO CAPS: 25.0000 mg | ORAL_CAPSULE | Freq: Four times a day (QID) | ORAL | Status: DC

## 2014-03-02 NOTE — MAU Provider Note (Signed)
First Provider Initiated Contact with Patient 03/02/14 1122      Chief Complaint:  Vaginal Bleeding and Abdominal Pain   Jillian Carlson is  38 y.o. [id number] at [redacted]w[redacted]d presents complaining of Vaginal Bleeding and Abdominal Pain All interactions through the Spanish interpreters.  To the RN, the patient stated she started having abdominal cramping yesterday, to the MD she states it began 2 weeks ago. The cramping is in the suprapubic area and comes and goes every 20 minutes to an hour. She states it may feel like contractions. No back/flank pain. No dysuria, vaginal discharge prior to the bleeding, or vaginal pruritus.  The patient first noted vaginal bleeding yesterday evening that was bright red blood with clots. The bleeding stopped but then started again this AM. She notes she has passed a clot that was the size of a golf ball. She denies any worsening abdominal pain with the bleeding. She last had intercourse 3 months ago.   Of note, an U/S on 8/25 noted a central previa.    No LOF. Good fetal movement.  Obstetrical/Gynecological History: OB History   Grav Para Term Preterm Abortions TAB SAB Ect Mult Living   Of note, the patient had a missed SAB in 08/2013.  Past Medical History: Past Medical History  Diagnosis Date  . Vaginal Pap smear, abnormal     Past Surgical History: Past Surgical History  Procedure Laterality Date  . Dilation and evacuation N/A 08/18/2013    Procedure: DILATATION AND EVACUATION;  Surgeon: Catalina Antigua, MD;  Location: [facility];  Service: Gynecology;  Laterality: N/A;    Family History: History reviewed. No pertinent family history.  Social History: History  Substance Use Topics  . Smoking status: Never Smoker   . Smokeless tobacco: Not on file  . Alcohol Use: No    Allergies: No Known Allergies  Meds:  Prescriptions prior to admission  Medication Sig Dispense Refill  . Prenatal Vit-Fe Fumarate-FA  (MULTIVITAMIN-PRENATAL) 27-0.8 MG TABS tablet Take 1 tablet by mouth daily at 12 noon.        Review of Systems -   Review of Systems  Constitutional: Negative for fever, chills, weight loss, malaise/fatigue and diaphoresis.  HENT: Negative for hearing loss, ear pain, nosebleeds, congestion, sore throat, neck pain, tinnitus and ear discharge.   Eyes: Negative for blurred vision, double vision, photophobia, pain, discharge and redness.  Respiratory: Negative for cough, hemoptysis, sputum production, shortness of breath, wheezing and stridor.   Cardiovascular: Negative for chest pain, palpitations, orthopnea,  leg swelling  Gastrointestinal: Negative for abdominal pain heartburn, nausea, vomiting, diarrhea, constipation, blood in stool Genitourinary: Negative for dysuria, urgency, frequency, hematuria and flank pain.  Musculoskeletal: Negative for myalgias, back pain, joint pain and falls.  Skin: Negative for itching and rash.  Neurological: Negative for dizziness, tingling, tremors, sensory change, speech change, focal weakness, seizures, loss of consciousness, weakness and headaches.  Endo/Heme/Allergies: Negative for environmental allergies and polydipsia. Does not bruise/bleed easily.  Psychiatric/Behavioral: Negative for depression, suicidal ideas, hallucinations, memory loss and substance abuse. The patient is not nervous/anxious and does not have insomnia.      Physical Exam  Blood pressure 111/55, pulse 87, temperature 99.1 F (37.3 C), temperature source Oral, resp. rate 16, height 4' 11.75" (1.518 m), weight 73.211 kg (161 lb 6.4 oz), last menstrual period 09/29/2013. GENERAL: Well-developed, well-nourished female in no acute distress.  LUNGS: Clear to auscultation bilaterally.  HEART:  Regular rate and rhythm. ABDOMEN: Soft, nontender, nondistended, gravid.  EXTREMITIES: Nontender, no edema, 2+ distal pulses. DTR's 2+ SPECULUM EXAM: Small/moderate amount of bright red blood with  some clotting noted in the vaginal vault and in the cervical os. No signs of trauma noted.  Toco: Sporadic contractions    Labs: Results for orders placed during the hospital encounter of 03/02/14 (from the past 24 hour(s))  URINALYSIS, ROUTINE W REFLEX MICROSCOPIC   Collection Time    03/02/14 10:25 AM      Result Value Ref Range   Color, Urine YELLOW  YELLOW   APPearance HAZY (*) CLEAR   Specific Gravity, Urine 1.015  1.005 - 1.030   pH 6.0  5.0 - 8.0   Glucose, UA NEGATIVE  NEGATIVE mg/dL   Hgb urine dipstick LARGE (*) NEGATIVE   Bilirubin Urine NEGATIVE  NEGATIVE   Ketones, ur NEGATIVE  NEGATIVE mg/dL   Protein, ur NEGATIVE  NEGATIVE mg/dL   Urobilinogen, UA 0.2  0.0 - 1.0 mg/dL   Nitrite NEGATIVE  NEGATIVE   Leukocytes, UA LARGE (*) NEGATIVE  URINE MICROSCOPIC-ADD ON   Collection Time    03/02/14 10:25 AM      Result Value Ref Range   Squamous Epithelial / LPF RARE  RARE   WBC, UA 7-10  <3 WBC/hpf   RBC / HPF TOO NUMEROUS TO COUNT  <3 RBC/hpf   Bacteria, UA RARE  RARE   Urine-Other MUCOUS PRESENT    CBC   Collection Time    03/02/14 11:30 AM      Result Value Ref Range   WBC 10.2  4.0 - 10.5 K/uL   RBC 3.28 (*) 3.87 - 5.11 MIL/uL   Hemoglobin 10.1 (*) 12.0 - 15.0 g/dL   HCT 62.9 (*) 52.8 - 41.3 %   MCV 89.0  78.0 - 100.0 fL   MCH 30.8  26.0 - 34.0 pg   MCHC 34.6  30.0 - 36.0 g/dL   RDW 24.4  01.0 - 27.2 %   Platelets 202  150 - 400 K/uL   Imaging Studies:  US Ob Detail + 14 Wk  02/09/2014   OBSTETRICAL ULTRASOUND: This exam was performed within a [facility]. The OB US report was generated in the AS system, and faxed to the ordering physician.   This report is available in the [employer]. See the [facility] Obstetric US report via the Image Link.   Assessment/Plan: Jillian Carlson is  38 y.o. [id number] at [redacted]w[redacted]d presents with vaginal bleeding since yesterday with a central previa noted on U/S on 8/25. Repeat U/S today noted the central previa  again and a cervical length of 4.26cm. Blood type: A positive. No signs of infection on exam, however patient did have some contractions while in the MAU. U/A positive for RBCs, hemoglobin, large leukocytes and 7-10 bacteria with rare squamous epithelia cells and bacteria. No urinary symptoms.   --Given indomethacin  at 1303. Will give indomethacin  q6hrs for 72 hours total for tocolysis. -- OB urine culture pending -- Discussed that currently if the fetus were to be delivered it would be non-viable and return precautions given such as worsening bleeding, increased abdominal pain, LOF, decreased fetal movement  -- Pt[facility] on 10[facility]1.  Joanna Puff 9/15/20153:41 PM  I was present for the exam and agree with above. POC per consult w/ [place].   Whitney, CNM 03/02/2014 5:59 PM

## 2014-03-02 NOTE — Progress Notes (Signed)
U/S report not yet available. U/S dept contacted, will scan in report.

## 2014-03-02 NOTE — MAU Note (Addendum)
States bleeding started yesterday and has increased. Last intercourse 3 months ago. C/O cramping, come and goes, started before bleeding. Denies problems with pregnancy so far. Goes to [facility]. When questioned, patient states she has placenta previa.

## 2014-03-02 NOTE — Discharge Instructions (Signed)
Placenta previa  (Placenta Previa) La placenta previa es un trastorno que padece una mujer embarazada cuando la placenta se implanta en la parte inferior del tero. La placenta cubre de manera parcial o total la abertura del cuello del tero. Es un problema, ya que el beb debe pasar a travs del cuello del tero durante el [place]. Hay tres tipos de placenta previa. Ellos son:   Placenta previa marginal. La placenta est cerca del cuello uterino pero no cubre la abertura.  Placenta previa parcial. La placenta cubre una parte de la abertura del cuello del tero.  Placenta previa completa. La placenta cubre toda la abertura del cuello del tero.  Segn el tipo de placenta previa,existe la probabilidad de que la placenta pueda ubicarse en una posicin normal y que deje de cubrir el cuello del tero, a medida que el embarazo avanza. Es importante que cumpla con todos los controles prenatales.  FACTORES DE RIESGO  Es ms probable que sufra placenta previa si:   Est embarazada de ms de un beb (embarazo mltiple).   La forma de su tero no es normal.   Tiene cicatrices en el revestimiento interno del tero.   Tuvo cirugas anteriores en el tero, como una cesrea.   Ya ha tenido un beb.   Tiene una historia de placenta previa.   Ha fumado o ingerido cocana Academic librarian.   Est embarazada y tiene 35 aos o ms.  SNTOMAS  El sntoma principal de la placenta previa es un sangrado vaginal indoloro, sbito, durante la segunda mitad del [place]. La cantidad del sangrado puede ser desde ligeramente leve a abundante. El sangrado puede detenerse por s mismo, pero siempre vuelve a ocurrir. Tambin puede haber clicos, contracciones regulares, dolor abdominal y Radiographer, therapeutic cintura.  DIAGNSTICO  La placenta previa puede diagnosticarse con una ecografa, encontrando la ubicacin de la placenta. La ecografa puede encontrar la placenta previa ya sea durante una visita prenatal de  rutina o luego de que se advierte un sangrado vaginal. Si le diagnostican placenta previa, Financial trader los exmenes vaginales para reducir el riesgo de un sangrado abundante. Es posible que la placenta previa no se diagnostique hasta que se produzca el sangrado durante el [place].  TRATAMIENTO  El tratamiento especfico depende de:   La cantidad de sangrado o si ste se ha detenido.  En qu etapa se encuentra del embarazo.   El [place] del beb.   La ubicacin del beb y de la placenta.   El tipo de placenta previa.  Segn esos factores, el mdico podr indicar:   Que disminuya la actividad fsica   Hacer reposo en cama, en su casa o en el hospital.  Reposo plvico Esto significa que no tenga relaciones sexuales, no use tampones, no se haga exmenes plvicos ni introduzca nada dentro de la vagina.  Una transfusin sangunea para reponer la prdida de [address]  Una cesrea si el sangrado es abundante y no puede ser controlado o si la placenta cubre completamente el cuello.  Medicamentos para TEFL teacher un trabajo de parto prematuro o para que [employer] pulmones del feto si es necesario iniciar el parto antes de que el embarazo llegue a trmino.  EN QU MOMENTO DEBE BUSCAR ASISTENCIA MDICA DE INMEDIATO SI LA ENVAN A SU CASA CON EL DIAGNSTICO DE PLACENTA PREVIA?  Solicite atencin mdica de inmediato si tiene sntomas de placenta previa. Debe concurrir al hospital para ser controlada inmediatamente. Nuevamente, esos sntomas son:  Sangrado vaginal repentino e indoloro, an una pequea cantidad.  Clicos o contracciones regulares.  Dolor en la cintura o en el abdomen. Document Released: 03/14/2005 Document Revised: 02/04/2013 [facility] Patient Information 2015 [place], [place]. This information is not intended to replace advice given to you by your health care provider. Make sure you discuss any questions you have with your health care  provider.  Informacin sobre Government social research officer  (Preterm Labor Information) El parto prematuro comienza antes de la semana 37 de [place]. La duracin de un embarazo normal es de 39 a 41 semanas.  CAUSAS  Generalmente no hay una causa que pueda identificarse del motivo por el que una mujer comienza un trabajo de parto prematuro. Sin embargo, una de las causas conocidas ms frecuentes son las infecciones. Las infecciones del tero, el cuello, la vagina, el lquido [place], la vejiga, los riones y Teacher, adult education de los pulmones (neumona) pueden hacer que el trabajo de parto se inicie. Otras causas que pueden sospecharse son:   Infecciones urogenitales, como infecciones por hongos y vaginosis bacteriana.   Anormalidades uterinas (forma del tero, sptum uterino, fibromas, hemorragias en la placenta).   Un cuello que ha sido operado (puede ser que no permanezca cerrado).   Malformaciones del feto.   Gestaciones mltiples (mellizos, trillizos y ms).   Ruptura del saco amnitico.  FACTORES DE RIESGO   Historia previa de parto prematuro.   Tener ruptura prematura de las membranas (RPM).   La placenta cubre la abertura del cuello (placenta previa).   La placenta se separa del tero (abrupcin placentaria).   El cuello es demasiado dbil para contener al beb en el tero (cuello incompetente).   Hay mucho lquido en el saco amnitico (polihidramnios).   Consumo de drogas o hbito de fumar durante Firefighter.   No aumentar de peso lo suficiente durante el [employer].   Mujeres menores de 18 aos o mayores de [address] [place].   Nivel socioeconmico bajo.   Pertenecer a Engineer, production. SNTOMAS  Los signos y sntomas del trabajo de parto prematuro son:   Public librarian similares a los Designer, jewellery, dolor abdominal o dolor de espalda.  Contracciones uterinas regulares, tan frecuentes como seis por hora, sin importar su intensidad (pueden ser suaves o dolorosas).  Contracciones  que comienzan en la parte superior del tero y se expanden hacia abajo, a la zona inferior del abdomen y la espalda.   Sensacin de aumento de presin en la pelvis.   Aparece una secrecin acuosa o sanguinolenta por la vagina.  TRATAMIENTO  Segn el tiempo del embarazo y otras [place], el mdico puede indicar reposo en cama. Si es necesario, le indicarn medicamentos para TEFL teacher las contracciones y para Customer service manager los pulmones del feto. Si el trabajo de parto se inicia antes de las 34 semanas de [place], se recomienda la hospitalizacin. El tratamiento depende de las condiciones en que se encuentren usted y el feto.  QU DEBE HACER SI PIENSA QUE EST EN TRABAJO DE PARTO PREMATURO?  Comunquese con su mdico inmediatamente. Debe concurrir al hospital para ser controlada inmediatamente.  CMO PUEDE EVITAR EL TRABAJO DE PARTO PREMATURO EN FUTUROS EMBARAZOS?  Usted debe:   Si fuma, abandonar el hbito.  Mantener un peso saludable y evitar sustancias qumicas y drogas innecesarias.  Controlar todo tipo de infeccin.  Informe a su mdico si tiene una historia conocida de trabajo de parto prematuro. Document Released: 09/11/2007 Document Revised: 02/04/2013 [facility] Patient Information 2015 [place], [place]. This information is not intended to replace  advice given to you by your health care provider. Make sure you discuss any questions you have with your health care provider.   Reposo plvico  (Pelvic Rest) El reposo plvico se recomienda a las mujeres cuando:   La placenta cubre parcial o completamente la abertura del cuello del tero (placenta previa).  Hay sangrado entre la pared del tero y el saco amnitico en el primer trimestre (hemorragia subcorinica).  El cuello uterino comienza a abrirse sin iniciarse el trabajo de parto (cuello uterino incompetente, insuficiencia cervical).  El [place] de parto se inicia muy pronto (parto prematuro). INSTRUCCIONES PARA EL CUIDADO EN  EL HOGAR   No tenga relaciones sexuales, estimulacin, ni orgasmos.  No use tampones, no se haga duchas vaginales ni coloque ningn objeto en la vagina.  No levante objetos que pesen ms de 10 libras (4,5 kg).  Evite las actividades extenuantes o tensionar los msculos de la pelvis. SOLICITE ATENCIN MDICA SI:   Tiene un sangrado vaginal durante el embarazo. Considrelo como una posible emergencia.  Siente clicos en la zona baja del estmago (ms fuertes que los clicos menstruales).  Nota flujo vaginal (acuoso, con moco o [place]).  Siente un dolor en la espalda leve y sordo.  Tiene contracciones regulares o endurecimiento del tero. SOLICITE ATENCIN MDICA DE INMEDIATO SI:  Observa sangrado vaginal y tiene placenta previa.  Document Released: 02/27/2012 [facility] Patient Information 2015 [place], [place]. This information is not intended to replace advice given to you by your health care provider. Make sure you discuss any questions you have with your health care provider.

## 2014-03-03 ENCOUNTER — Encounter: Payer: Self-pay | Admitting: Advanced Practice Midwife

## 2014-03-03 DIAGNOSIS — O4400 Placenta previa specified as without hemorrhage, unspecified trimester: Secondary | ICD-10-CM | POA: Insufficient documentation

## 2014-03-03 LAB — CULTURE, OB URINE
Colony Count: NO GROWTH
Culture: NO GROWTH

## 2014-03-03 NOTE — MAU Provider Note (Signed)
Attestation of Attending Supervision of Advanced Practitioner (PA/CNM/NP): Evaluation and management procedures were performed by the Advanced Practitioner under my supervision and collaboration.  I have reviewed the Advanced Practitioner's note and chart, and I agree with the management and plan.  Jaystin Mcgarvey, DO Attending Physician Faculty Practice, [facility]  

## 2014-03-04 ENCOUNTER — Inpatient Hospital Stay (HOSPITAL_COMMUNITY)
Admission: AD | Admit: 2014-03-04 | Discharge: 2014-03-29 | DRG: 782 | Disposition: A | Discharge status: Other Acute Inpatient Hospital | Payer: Medicaid Other | Source: Ambulatory Visit | Attending: Obstetrics & Gynecology | Admitting: Obstetrics & Gynecology

## 2014-03-04 ENCOUNTER — Observation Stay (HOSPITAL_COMMUNITY): Payer: Medicaid Other

## 2014-03-04 ENCOUNTER — Encounter (HOSPITAL_COMMUNITY): Payer: Self-pay | Admitting: *Deleted

## 2014-03-04 DIAGNOSIS — O4402 Placenta previa specified as without hemorrhage, second trimester: Secondary | ICD-10-CM

## 2014-03-04 DIAGNOSIS — O4410 Placenta previa with hemorrhage, unspecified trimester: Secondary | ICD-10-CM

## 2014-03-04 DIAGNOSIS — O42912 Preterm premature rupture of membranes, unspecified as to length of time between rupture and onset of labor, second trimester: Secondary | ICD-10-CM

## 2014-03-04 DIAGNOSIS — Z3A25 25 weeks gestation of pregnancy: Secondary | ICD-10-CM

## 2014-03-04 DIAGNOSIS — O4290 Premature rupture of membranes, unspecified as to length of time between rupture and onset of labor, unspecified weeks of gestation: Secondary | ICD-10-CM

## 2014-03-04 DIAGNOSIS — O4412 Placenta previa with hemorrhage, second trimester: Secondary | ICD-10-CM | POA: Diagnosis present

## 2014-03-04 DIAGNOSIS — O09522 Supervision of elderly multigravida, second trimester: Secondary | ICD-10-CM

## 2014-03-04 DIAGNOSIS — Z3A22 22 weeks gestation of pregnancy: Secondary | ICD-10-CM | POA: Diagnosis present

## 2014-03-04 DIAGNOSIS — O42112 Preterm premature rupture of membranes, onset of labor more than 24 hours following rupture, second trimester: Secondary | ICD-10-CM

## 2014-03-04 DIAGNOSIS — Z331 Pregnant state, incidental: Secondary | ICD-10-CM

## 2014-03-04 DIAGNOSIS — O321XX0 Maternal care for breech presentation, not applicable or unspecified: Secondary | ICD-10-CM | POA: Diagnosis present

## 2014-03-04 LAB — CBC
HEMATOCRIT: 27.5 % — AB (ref 36.0–46.0)
Hemoglobin: 9.3 g/dL — ABNORMAL LOW (ref 12.0–15.0)
MCH: 30 pg (ref 26.0–34.0)
MCHC: 33.8 g/dL (ref 30.0–36.0)
MCV: 88.7 fL (ref 78.0–100.0)
PLATELETS: 206 10*3/uL (ref 150–400)
RBC: 3.1 MIL/uL — AB (ref 3.87–5.11)
RDW: 13.6 % (ref 11.5–15.5)
WBC: 9.5 10*3/uL (ref 4.0–10.5)

## 2014-03-04 LAB — TYPE AND SCREEN
ABO/RH(D): A POS
ANTIBODY SCREEN: NEGATIVE

## 2014-03-04 MED ORDER — PRENATAL MULTIVITAMIN CH
1.0000 | ORAL_TABLET | Freq: Every day | ORAL | Status: DC
  Administered 2014-03-05 – 2014-03-29 (×24): 1 via ORAL
  Filled 2014-03-04 (×24): qty 1

## 2014-03-04 MED ORDER — SODIUM CHLORIDE 0.9 % IV SOLN
2.0000 g | Freq: Four times a day (QID) | INTRAVENOUS | Status: AC
  Administered 2014-03-04 – 2014-03-06 (×8): 2 g via INTRAVENOUS
  Filled 2014-03-04 (×8): qty 2000

## 2014-03-04 MED ORDER — SODIUM CHLORIDE 0.9 % IV SOLN: 250.0000 mL | INTRAVENOUS | Status: DC | PRN

## 2014-03-04 MED ORDER — DOCUSATE SODIUM 100 MG PO CAPS
100.0000 mg | ORAL_CAPSULE | Freq: Every day | ORAL | Status: DC
  Administered 2014-03-05 – 2014-03-29 (×23): 100 mg via ORAL
  Filled 2014-03-04 (×23): qty 1

## 2014-03-04 MED ORDER — CALCIUM CARBONATE ANTACID 500 MG PO CHEW: 2.0000 | CHEWABLE_TABLET | ORAL | Status: DC | PRN

## 2014-03-04 MED ORDER — LACTATED RINGERS IV SOLN
INTRAVENOUS | Status: DC
  Administered 2014-03-04 – 2014-03-08 (×12): via INTRAVENOUS

## 2014-03-04 MED ORDER — AZITHROMYCIN 250 MG PO TABS
500.0000 mg | ORAL_TABLET | Freq: Every day | ORAL | Status: AC
  Administered 2014-03-06 – 2014-03-10 (×5): 500 mg via ORAL
  Filled 2014-03-04 (×5): qty 2

## 2014-03-04 MED ORDER — ZOLPIDEM TARTRATE 5 MG PO TABS: 5.0000 mg | ORAL_TABLET | Freq: Every evening | ORAL | Status: DC | PRN

## 2014-03-04 MED ORDER — DEXTROSE 5 % IV SOLN
500.0000 mg | INTRAVENOUS | Status: AC
  Administered 2014-03-04 – 2014-03-05 (×2): 500 mg via INTRAVENOUS
  Filled 2014-03-04 (×2): qty 500

## 2014-03-04 MED ORDER — AMOXICILLIN 500 MG PO CAPS
500.0000 mg | ORAL_CAPSULE | Freq: Three times a day (TID) | ORAL | Status: AC
  Administered 2014-03-06 – 2014-03-11 (×15): 500 mg via ORAL
  Filled 2014-03-04 (×15): qty 1

## 2014-03-04 MED ORDER — SODIUM CHLORIDE 0.9 % IJ SOLN: 3.0000 mL | INTRAMUSCULAR | Status: DC | PRN

## 2014-03-04 MED ORDER — SODIUM CHLORIDE 0.9 % IJ SOLN
3.0000 mL | Freq: Two times a day (BID) | INTRAMUSCULAR | Status: DC
  Administered 2014-03-04 – 2014-03-28 (×38): 3 mL via INTRAVENOUS

## 2014-03-04 MED ORDER — ACETAMINOPHEN 325 MG PO TABS
650.0000 mg | ORAL_TABLET | ORAL | Status: DC | PRN
  Administered 2014-03-05 – 2014-03-29 (×3): 650 mg via ORAL
  Filled 2014-03-04 (×3): qty 2

## 2014-03-04 NOTE — MAU Provider Note (Signed)
Attestation of Attending Supervision of Advanced Practitioner (CNM/NP): Evaluation and management procedures were performed by the Advanced Practitioner under my supervision and collaboration.  I have reviewed the Advanced Practitioner's note and chart, and I agree with the management and plan.  HARRAWAY-SMITH, [place] Osterhout 10:19 PM     

## 2014-03-04 NOTE — MAU Provider Note (Signed)
Chief Complaint:  Rupture of Membranes   First Provider Initiated Contact with Patient 03/04/14 1840     HPI: Jillian Carlson is a 38 y.o. [id number] at [redacted]w[redacted]d who presents to maternity admissions reporting 2 gushes of brown-bloody fluid at noon and 2:30. Each soaked though her clothing and mild cramping and decrease FM. Know central previa. Seen in MAU 2 days ago. UI. CL 4.26 mu abd [place].     Past Medical History: Past Medical History  Diagnosis Date  . Vaginal Pap smear, abnormal     Past obstetric history: OB History  Gravida Para Term Preterm AB SAB TAB Ectopic Multiple Living  # Outcome Date GA Lbr Len/2nd Weight Sex Delivery Anes PTL Lv  8 CUR           7 SAB 08/18/13          6 TRM 10/12/03    M SVD   Y  5 TRM 08/29/98    F SVD   Y  4 PRE 05/14/97 [redacted]w[redacted]d   M SVD   Y  3 TRM 04/01/96    M SVD   Y  2 TRM 02/15/94    M SVD   ND  1 TRM 04/12/92    F SVD   Y      Past Surgical History: Past Surgical History  Procedure Laterality Date  . Dilation and evacuation N/A 08/18/2013    Procedure: DILATATION AND EVACUATION;  Surgeon: Catalina Antigua, MD;  Location: [facility];  Service: Gynecology;  Laterality: N/A;     Family History: History reviewed. No pertinent family history.  Social History: History  Substance Use Topics  . Smoking status: Never Smoker   . Smokeless tobacco: Not on file  . Alcohol Use: No    Allergies: No Known Allergies  Meds:  Prescriptions prior to admission  Medication Sig Dispense Refill  . indomethacin (INDOCIN) 25 MG capsule Take 1 capsule (25 mg total) by mouth every 6 (six) hours.  12 capsule  0  . Prenatal Vit-Fe Fumarate-FA (MULTIVITAMIN-PRENATAL) 27-0.8 MG TABS tablet Take 1 tablet by mouth daily at 12 noon.        ROS: Pertinent findings in history of present illness. Neg for fever, chills, abd tenderness, dizziness.  Physical Exam  Blood pressure 112/54, pulse 98, temperature 99.4 F (37.4 C), temperature source  Oral, resp. rate 18, last menstrual period 09/29/2013. GENERAL: Well-developed, well-nourished female in no acute distress.  HEENT: normocephalic HEART: normal rate RESP: normal effort ABDOMEN: Soft, non-tender, gravid appropriate for gestational age EXTREMITIES: Nontender, no edema NEURO: alert and oriented SPECULUM EXAM: NEFG, pooling of large amount of bloody fluid, cervix clean.  Cervix Visually closed   FHT:  Baseline 154 by doppler Contractions: frequent UI   Labs: Fern positive  Imaging:  NA  MAU Course: CBC, T&S, IV  Assessment: PPPROM Previa, bleeding stable  Plan: Obs on antenatal per consult w/ Dr. Erin Fulling Complete US  Crossmatch blood MFM consult Lengthy discussion w pt about high risk of bleeding, previable baby. Pt wants to do everything possible to continue pregnancy.  [place], [place] 03/04/2014 6:24 PM

## 2014-03-04 NOTE — H&P (Signed)
Chief Complaint:  Rupture of Membranes   First Provider Initiated Contact with Patient 03/04/14 1840     HPI: Jillian Carlson is a 38 y.o. G8P5115 at [redacted]w[redacted]d who presents to maternity admissions reporting 2 gushes of brown-bloody fluid at noon and 2:30. Each soaked though her clothing and mild cramping and decrease FM. Know central previa. Seen in MAU 2 days ago. UI. CL 4.26 mu abd US.     Past Medical History: Past Medical History  Diagnosis Date  . Vaginal Pap smear, abnormal     Past obstetric history: OB History  Gravida Para Term Preterm AB SAB TAB Ectopic Multiple Living  8 6 5 1 1 1    5    # Outcome Date GA Lbr Len/2nd Weight Sex Delivery Anes PTL Lv  8 CUR           7 SAB 08/18/13          6 TRM 10/12/03    M SVD   Y  5 TRM 08/29/98    F SVD   Y  4 PRE 05/14/97 [redacted]w[redacted]d   M SVD   Y  3 TRM 04/01/96    M SVD   Y  2 TRM 02/15/94    M SVD   ND  1 TRM 04/12/92    F SVD   Y      Past Surgical History: Past Surgical History  Procedure Laterality Date  . Dilation and evacuation N/A 08/18/2013    Procedure: DILATATION AND EVACUATION;  Surgeon: Peggy Constant, MD;  Location: [facility];  Service: Gynecology;  Laterality: N/A;     Family History: History reviewed. No pertinent family history.  Social History: History  Substance Use Topics  . Smoking status: Never Smoker   . Smokeless tobacco: Not on file  . Alcohol Use: No    Allergies: No Known Allergies  Meds:  Prescriptions prior to admission  Medication Sig Dispense Refill  . indomethacin (INDOCIN) 25 MG capsule Take 1 capsule (25 mg total) by mouth every 6 (six) hours.  12 capsule  0  . Prenatal Vit-Fe Fumarate-FA (MULTIVITAMIN-PRENATAL) 27-0.8 MG TABS tablet Take 1 tablet by mouth daily at 12 noon.        ROS: Pertinent findings in history of present illness. Neg for fever, chills, abd tenderness, dizziness.  Physical Exam  Blood pressure 112/54, pulse 98, temperature 99.4 F (37.4 C), temperature source  Oral, resp. rate 18, last menstrual period 09/29/2013. GENERAL: Well-developed, well-nourished female in no acute distress.  HEENT: normocephalic HEART: normal rate RESP: normal effort ABDOMEN: Soft, non-tender, gravid appropriate for gestational age EXTREMITIES: Nontender, no edema NEURO: alert and oriented SPECULUM EXAM: NEFG, pooling of large amount of bloody fluid, cervix clean.  Cervix Visually closed   FHT:  Baseline 154 by doppler Contractions: frequent UI   Labs: Fern positive  Imaging:  NA  MAU Course: CBC, T&S, IV  Assessment: PPPROM Previa, bleeding stable  Plan: Obs on antenatal per consult w/ Dr. Harraway-Maiko Salais Complete US  Crossmatch blood MFM consult Lengthy discussion w pt about high risk of bleeding, previable baby. Pt wants to do everything possible to continue pregnancy.  [name], CNM 03/04/2014 6:24 PM     Assessment:  PPPROM  Previa, bleeding stable  Plan:  Obs on antenatal per consult w/ Dr. Erin Fulling  Complete US  Crossmatch blood  MFM consult  Lengthy discussion w pt about high risk of bleeding, previable baby. Pt wants to do everything possible to continue pregnancy.  [name], [place]  03/04/2014  6:24 PM

## 2014-03-04 NOTE — H&P (Signed)
Attestation of Attending Supervision of Advanced Practitioner (CNM/NP): Evaluation and management procedures were performed by the Advanced Practitioner under my supervision and collaboration.  I have reviewed the Advanced Practitioner's note and chart, and I agree with the management and plan.  HARRAWAY-SMITH, Quatisha Zylka 10:19 PM

## 2014-03-04 NOTE — H&P (Signed)
FACULTY PRACTICE ANTEPARTUM ADMISSION HISTORY AND PHYSICAL NOTE Pt was seen 2 day prev with c/o vaginal bleeding she was noted to have a central placenta previa that had prev been noted on Aug 25th.  She presents today with c/o leakage of fluid at 12 and 2pm.  She reports 2 large gushes of fluid.  She denies f/c/n/v.    History of Present Illness: Wilda Avila-Benitez is a 38 y.o. [id number] at [redacted]w[redacted]d admitted for preterm premature rupture of membranes Patient reports the fetal movement as active. Patient reports uterine contraction  activity as mild cramping. Patient reports  vaginal bleeding as spotting. Patient describes fluid per vagina as Other brown cloudy liquid. Fetal presentation is unsure.  Patient Active Problem List   Diagnosis Date Noted  . PROM (premature rupture of membranes) 03/04/2014  . Placenta previa centralis 03/03/2014    Past Medical History  Diagnosis Date  . Vaginal Pap smear, abnormal     Past Surgical History  Procedure Laterality Date  . Dilation and evacuation N/A 08/18/2013    Procedure: DILATATION AND EVACUATION;  Surgeon: Catalina Antigua, MD;  Location: [facility];  Service: Gynecology;  Laterality: N/A;    OB History  Gravida Para Term Preterm AB SAB TAB Ectopic Multiple Living  # Outcome Date GA Lbr Len/2nd Weight Sex Delivery Anes PTL Lv  8 CUR           7 SAB 08/18/13          6 TRM 10/12/03    M SVD   Y  5 TRM 08/29/98    F SVD   Y  4 PRE 05/14/97 [redacted]w[redacted]d   M SVD   Y  3 TRM 04/01/96    M SVD   Y  2 TRM 02/15/94    M SVD   ND  1 TRM 04/12/92    F SVD   Y      History   Social History  . Marital Status: Married    Spouse Name: N/A    Number of Children: N/A  . Years of Education: N/A   Social History Main Topics  . Smoking status: Never Smoker   . Smokeless tobacco: None  . Alcohol Use: No  . Drug Use: No  . Sexual Activity: Not Currently    Birth Control/ Protection: None   Other Topics Concern  . None    Social History Narrative  . None    History reviewed. No pertinent family history.  No Known Allergies  Prescriptions prior to admission  Medication Sig Dispense Refill  . indomethacin (INDOCIN) 25 MG capsule Take 1 capsule (25 mg total) by mouth every 6 (six) hours.  12 capsule  0  . Prenatal Vit-Fe Fumarate-FA (MULTIVITAMIN-PRENATAL) 27-0.8 MG TABS tablet Take 1 tablet by mouth daily at 12 noon.        Review of Systems - Negative except complete previa current pregnancy  Vitals:  BP 102/46  Pulse 78  Temp(Src) 98.9 F (37.2 C) (Oral)  Resp 18  Ht 4' 11.75" (1.518 m)  Wt 161 lb (73.029 kg)  BMI 31.69 kg/m2  SpO2 100%  LMP 09/29/2013 Physical Examination: GENERAL: Well-developed, well-nourished female in no acute distress.  LUNGS: Clear to auscultation bilaterally.  HEART: Regular rate and rhythm. ABDOMEN: Soft, nontender, nondistended. No organomegaly. EXTREMITIES: No cyanosis, clubbing, or edema Done by Dorathy Kinsman in MAU: CERVIX: Evaluated by sterile speculum exam. and found  to be closed visually  Membranes:grossly ruptured with large amount of cloudy fluid in vagina  Fetal Monitoring:Baseline: 150's bpm Tocometer: irritability with ctx.  Ctx NOT palpable  Labs:  Results for orders placed during the hospital encounter of 03/04/14 (from the past 24 hour(s))  CBC   Collection Time    03/04/14  6:54 PM      Result Value Ref Range   WBC 9.5  4.0 - 10.5 K/uL   RBC 3.10 (*) 3.87 - 5.11 MIL/uL   Hemoglobin 9.3 (*) 12.0 - 15.0 g/dL   HCT 40.9 (*) 81.1 - 91.4 %   MCV 88.7  78.0 - 100.0 fL   MCH 30.0  26.0 - 34.0 pg   MCHC 33.8  30.0 - 36.0 g/dL   RDW 78.2  95.6 - 21.3 %   Platelets 206  150 - 400 K/uL  TYPE AND SCREEN   Collection Time    03/04/14  6:54 PM      Result Value Ref Range   ABO/RH(D) A POS     Antibody Screen NEG     Sample Expiration 03/07/2014      Imaging Studies: US Ob Detail + 14 Wk  02/09/2014   OBSTETRICAL ULTRASOUND: This exam was  performed within a [place] [facility] Department. The OB US report was generated in the AS system, and faxed to the ordering physician.   This report is available in the [employer]. See the AS Obstetric US report via the Image Link.  US Ob Limited  03/02/2014   OBSTETRICAL ULTRASOUND: This exam was performed within a [place] [facility] Department. The OB US report was generated in the AS system, and faxed to the ordering physician.   This report is available in the [employer]. See the AS Obstetric US report via the Image Link.  [place] Mfm Ob Transvaginal  03/02/2014   OBSTETRICAL ULTRASOUND: This exam was performed within a [place] [facility] Department. The OB US report was generated in the AS system, and faxed to the ordering physician.   This report is available in the [employer]. See the AS Obstetric US report via the Image Link.    Prenatal Transfer Tool  Maternal Diabetes: No Genetic Screening: Declined Maternal Ultrasounds/Referrals: Abnormal:  Findings:   Other:placenta previa- complete  Fetal Ultrasounds or other Referrals:  None Maternal Substance Abuse:  No Significant Maternal Medications:  None Significant Maternal Lab Results: None   Assessment and Plan: Patient Active Problem List   Diagnosis Date Noted  . PROM (premature rupture of membranes) 03/04/2014  . Placenta previa centralis 03/03/2014   I spoke with Dr. Claudean Severance about this pt given that she is previable and has a complete previa.  He rec admit, atbx for latency, sono to EFW.  He discussed D&E at another facility vs hysterotomy.  He will speak to one of his partners after the review of the sono and discuss with the team in the am.   I spoke with patient about D&E vs hysterotomy.  I made it clear that if we attempted to proceed with a hysterotomy if bleeding occurred that we still could not confirm viability especially if bleeding occurs at an early [place].  Pt aware of the challenge of delivery given the  complete previa. She will consider the info given and await the sono in the am.     Admit to Antenatal Antibiotics for latency (Amp and Azithromycin) F/u sono for EFW and BPD Dr. Claudean Severance to consult in the  am Routine antenatal care  All conversation with pt done via Spanish interpreter  Eber Jones L. Harraway-Smith, M.D., [facility]

## 2014-03-04 NOTE — MAU Note (Signed)
Pt reports she thinks her water broke around 12pm today. Reports fluid is brown and watery. Reports fetal movement is less than normal. Reports she is having some mild pain in her abd once or twice and hour.

## 2014-03-05 DIAGNOSIS — O479 False labor, unspecified: Secondary | ICD-10-CM | POA: Diagnosis present

## 2014-03-05 DIAGNOSIS — O4412 Placenta previa with hemorrhage, second trimester: Secondary | ICD-10-CM | POA: Diagnosis not present

## 2014-03-05 DIAGNOSIS — Z3A22 22 weeks gestation of pregnancy: Secondary | ICD-10-CM | POA: Diagnosis not present

## 2014-03-05 DIAGNOSIS — O9989 Other specified diseases and conditions complicating pregnancy, childbirth and the puerperium: Secondary | ICD-10-CM | POA: Diagnosis present

## 2014-03-05 DIAGNOSIS — O4290 Premature rupture of membranes, unspecified as to length of time between rupture and onset of labor, unspecified weeks of gestation: Secondary | ICD-10-CM | POA: Diagnosis present

## 2014-03-05 DIAGNOSIS — O42912 Preterm premature rupture of membranes, unspecified as to length of time between rupture and onset of labor, second trimester: Secondary | ICD-10-CM | POA: Diagnosis not present

## 2014-03-05 DIAGNOSIS — O09522 Supervision of elderly multigravida, second trimester: Secondary | ICD-10-CM | POA: Diagnosis not present

## 2014-03-05 DIAGNOSIS — O4410 Placenta previa with hemorrhage, unspecified trimester: Secondary | ICD-10-CM | POA: Diagnosis present

## 2014-03-05 DIAGNOSIS — O99891 Other specified diseases and conditions complicating pregnancy: Secondary | ICD-10-CM | POA: Diagnosis present

## 2014-03-05 DIAGNOSIS — O321XX0 Maternal care for breech presentation, not applicable or unspecified: Secondary | ICD-10-CM | POA: Diagnosis not present

## 2014-03-05 MED ORDER — LACTATED RINGERS IV BOLUS (SEPSIS)
500.0000 mL | Freq: Once | INTRAVENOUS | Status: AC
  Administered 2014-03-05: 500 mL via INTRAVENOUS

## 2014-03-05 NOTE — Progress Notes (Signed)
Patient ID: Jillian Carlson, female   DOB: 10/09/1975, 38 y.o.   MRN: [id number] ACULTY PRACTICE ANTEPARTUM COMPREHENSIVE PROGRESS NOTE  Jillian Carlson is a 38 y.o. [id number] at [redacted]w[redacted]d  who is admitted for previable PROM.   Fetal presentation is unsure. Length of Stay:  1  Days  Subjective: Pt reports  No LOF overnight and just a small amount of bleeding Patient reports good fetal movement.  She reports no uterine contractions.,  Vitals:  Blood pressure 82/36, pulse 88, temperature 99.1 F (37.3 C), temperature source Oral, resp. rate 20, height 4' 11.75" (1.518 m), weight 161 lb (73.029 kg), last menstrual period 09/29/2013, SpO2 97.00%. Physical Examination: General appearance - alert, well appearing, and in no distress Chest - clear to auscultation, no wheezes, rales or rhonchi, symmetric air entry Heart - normal rate, regular rhythm, normal S1, S2, no murmurs, rubs, clicks or gallops Abdomen - soft, nontender, nondistended, no masses or organomegaly gravid Extremities - no pedal edema noted Cervical Exam: Not evaluated.  Membranes:ruptured  Fetal Monitoring:  Toco: irreg contractions- not palpable  +FHR  Labs:  Results for orders placed during the hospital encounter of 03/04/14 (from the past 24 hour(s))  CBC   Collection Time    03/04/14  6:54 PM      Result Value Ref Range   WBC 9.5  4.0 - 10.5 K/uL   RBC 3.10 (*) 3.87 - 5.11 MIL/uL   Hemoglobin 9.3 (*) 12.0 - 15.0 g/dL   HCT 14.7 (*) 82.9 - 56.2 %   MCV 88.7  78.0 - 100.0 fL   MCH 30.0  26.0 - 34.0 pg   MCHC 33.8  30.0 - 36.0 g/dL   RDW 13.0  86.5 - 78.4 %   Platelets 206  150 - 400 K/uL  TYPE AND SCREEN   Collection Time    03/04/14  6:54 PM      Result Value Ref Range   ABO/RH(D) A POS     Antibody Screen NEG     Sample Expiration 03/07/2014      Imaging Studies:    sono done last night; results pending   Medications:  Scheduled . ampicillin (OMNIPEN) IV  2 g Intravenous Q6H   Followed by  . [START  ON 03/06/2014] amoxicillin  500 mg Oral Q8H  . azithromycin  500 mg Intravenous Q24H   Followed by  . [START ON 03/06/2014] azithromycin  500 mg Oral QHS  . docusate sodium  100 mg Oral Daily  . prenatal multivitamin  1 tablet Oral Q1200  . sodium chloride  3 mL Intravenous Q12H   I have reviewed the patient's current medications.  ASSESSMENT: Patient Active Problem List   Diagnosis Date Noted  . PROM (premature rupture of membranes) 03/04/2014  . Placenta previa centralis 03/03/2014    PLAN: Keep antibiotics for latency MFM consult NICU consult at 22 6/[redacted] weeks gestation BMZ at 22 6/[redacted] weeks gestation Watch for s/sx of bleeding Reg diet for now  Continue routine antenatal care.   HARRAWAY-SMITH, Yovany Clock 03/05/2014,8:13 AM

## 2014-03-05 NOTE — Progress Notes (Signed)
UR chart review completed.  

## 2014-03-05 NOTE — Progress Notes (Signed)
Assisted Dr. Erin Fulling with explanation of care plan. Eda H Royal Interpreter.

## 2014-03-06 DIAGNOSIS — O4410 Placenta previa with hemorrhage, unspecified trimester: Secondary | ICD-10-CM

## 2014-03-06 DIAGNOSIS — O4290 Premature rupture of membranes, unspecified as to length of time between rupture and onset of labor, unspecified weeks of gestation: Secondary | ICD-10-CM

## 2014-03-06 NOTE — Progress Notes (Signed)
Patient ID: Jillian Carlson, female   DOB: 08/10/1975, 38 y.o.   MRN: [id number] ACULTY PRACTICE ANTEPARTUM COMPREHENSIVE PROGRESS NOTE  Jillian Carlson is a 38 y.o. [id number] at [redacted]w[redacted]d  who is admitted for previable PROM with a placenta previa   Fetal presentation is unsure. Length of Stay:  2  Days  Subjective: Pt denies LOF overnight and just a small amount of bleeding. She changed 2 pads overnight which were moderately stained. Unchanged bleeding pattern since admission Patient reports good fetal movement.  She reports no uterine contractions.,  Vitals:  Blood pressure 125/75, pulse 75, temperature 98.7 F (37.1 C), temperature source Oral, resp. rate 18, height 4' 11.75" (1.518 m), weight 161 lb (73.029 kg), last menstrual period 09/29/2013, SpO2 97.00%. Physical Examination: General appearance - alert, well appearing, and in no distress Chest - clear to auscultation, no wheezes, rales or rhonchi, symmetric air entry Heart - normal rate, regular rhythm, normal S1, S2, no murmurs, rubs, clicks or gallops Abdomen - soft, nontender, nondistended, gravid Extremities - no pedal edema noted Cervical Exam: Not evaluated.  Membranes:ruptured  Fetal Monitoring:  Toco: irregular contractions +FHR  Labs:  No results found for this or any previous visit (from the past 24 hour(s)).  Imaging Studies:       Medications:  Scheduled . ampicillin (OMNIPEN) IV  2 g Intravenous Q6H   Followed by  . amoxicillin  500 mg Oral Q8H  . azithromycin  500 mg Oral QHS  . docusate sodium  100 mg Oral Daily  . prenatal multivitamin  1 tablet Oral Q1200  . sodium chloride  3 mL Intravenous Q12H   I have reviewed the patient's current medications.  ASSESSMENT: Patient Active Problem List   Diagnosis Date Noted  . Premature labor with rupture of membranes 03/05/2014  . PROM (premature rupture of membranes) 03/04/2014  . Placenta previa centralis 03/03/2014    PLAN: Keep antibiotics for  latency NICU consult at 22 6/[redacted] weeks gestation BMZ at 22 6/[redacted] weeks gestation Watch for worsening in vaginal bleeding  Reg diet for now  Continue routine antenatal care.   Macario Shear 03/06/2014,6:46 AM

## 2014-03-06 NOTE — Progress Notes (Signed)
I stopped by this room during my shift to check and patients need, I was told by [place] Rn,the patient is been asleep Jillian Carlson

## 2014-03-06 NOTE — Progress Notes (Signed)
I assisted Dr Catalina Antigua this morning with some questions  during a patient  evaluation  By Orlan Leavens

## 2014-03-07 LAB — TYPE AND SCREEN
ABO/RH(D): A POS
ANTIBODY SCREEN: NEGATIVE

## 2014-03-07 MED ORDER — POLYETHYLENE GLYCOL 3350 17 G PO PACK
17.0000 g | PACK | Freq: Every day | ORAL | Status: DC
  Administered 2014-03-07 – 2014-03-29 (×17): 17 g via ORAL
  Filled 2014-03-07 (×24): qty 1

## 2014-03-07 NOTE — Progress Notes (Addendum)
Patient ID: Jillian Carlson, female   DOB: 03-29-1976, 38 y.o.   MRN: [id number] ACULTY PRACTICE ANTEPARTUM COMPREHENSIVE PROGRESS NOTE  Jillian Carlson is a 38 y.o. [id number] at [redacted]w[redacted]d  who is admitted for previable PROM with a placenta previa   Fetal presentation is unsure. Length of Stay:  3  Days  Subjective: Pt denies LOF overnight and just a small amount of bleeding. She changed 1 pad overnight which were moderately stained. Unchanged bleeding pattern since admission Patient reports good fetal movement.  She reports no uterine contractions.,  Vitals:  Blood pressure 95/39, pulse 68, temperature 98.7 F (37.1 C), temperature source Oral, resp. rate 18, height 4' 11.75" (1.518 m), weight 161 lb (73.029 kg), last menstrual period 09/29/2013, SpO2 97.00%. Physical Examination: General appearance - alert, well appearing, and in no distress Chest - clear to auscultation, no wheezes, rales or rhonchi, symmetric air entry Heart - normal rate, regular rhythm, normal S1, S2, no murmurs, rubs, clicks or gallops Abdomen - soft, nontender, nondistended, gravid Extremities - no pedal edema noted Cervical Exam: Not evaluated.  Membranes:ruptured  Fetal Monitoring:  Toco: irregular contractions +FHR  Labs:  No results found for this or any previous visit (from the past 24 hour(s)).  Imaging Studies:       Medications:  Scheduled . amoxicillin  500 mg Oral Q8H  . azithromycin  500 mg Oral QHS  . docusate sodium  100 mg Oral Daily  . prenatal multivitamin  1 tablet Oral Q1200  . sodium chloride  3 mL Intravenous Q12H   I have reviewed the patient's current medications.  ASSESSMENT: Patient Active Problem List   Diagnosis Date Noted  . Premature labor with rupture of membranes 03/05/2014  . PROM (premature rupture of membranes) 03/04/2014  . Placenta previa centralis 03/03/2014    PLAN: Keep antibiotics for latency NICU consult at 22 6/[redacted] weeks gestation BMZ at 22 6/[redacted] weeks  gestation Watch for worsening in vaginal bleeding  Reg diet for now  Continue routine antenatal care.   Jillian Carlson 03/07/2014,9:40 AM

## 2014-03-07 NOTE — Plan of Care (Signed)
Problem: Consults Goal: Birthing Suites Patient Information Press F2 to bring up selections list   Pt < [redacted] weeks EGA     

## 2014-03-08 MED ORDER — BETAMETHASONE SOD PHOS & ACET 6 (3-3) MG/ML IJ SUSP
12.0000 mg | INTRAMUSCULAR | Status: AC
  Administered 2014-03-08 – 2014-03-09 (×2): 12 mg via INTRAMUSCULAR
  Filled 2014-03-08 (×2): qty 2

## 2014-03-08 NOTE — Progress Notes (Signed)
Ur chart review completed.  

## 2014-03-08 NOTE — Progress Notes (Signed)
Neo Consult called to Dr. Francine Graven

## 2014-03-08 NOTE — Progress Notes (Signed)
Patient ID: Jillian Carlson, female   DOB: 29-Aug-1975, 38 y.o.   MRN: [id number] ACULTY PRACTICE ANTEPARTUM COMPREHENSIVE PROGRESS NOTE  Jillian Carlson is a 38 y.o. [id number] at [redacted]w[redacted]d  who is admitted for previable PROM with a placenta previa   Fetal presentation is unsure. Length of Stay:  4  Days  Subjective: Pt reports no further vaginal bleeding since yesterday afternoon. Patient reports good fetal movement.  She reports no uterine contractions.,  Vitals:  Blood pressure 99/41, pulse 77, temperature 98.1 F (36.7 C), temperature source Oral, resp. rate 18, height 4' 11.75" (1.518 m), weight 161 lb (73.029 kg), last menstrual period 09/29/2013, SpO2 97.00%. Physical Examination: General appearance - alert, well appearing, and in no distress Abdomen - soft, nontender, nondistended, gravid Extremities - no pedal edema noted Cervical Exam: Not evaluated.  Membranes:ruptured  Fetal Monitoring:  Toco: no contractions +FHR  Labs:  Results for orders placed during the hospital encounter of 03/04/14 (from the past 24 hour(s))  TYPE AND SCREEN   Collection Time    03/07/14 12:00 PM      Result Value Ref Range   ABO/RH(D) A POS     Antibody Screen NEG     Sample Expiration 03/10/2014      Imaging Studies:       Medications:  Scheduled . amoxicillin  500 mg Oral Q8H  . azithromycin  500 mg Oral QHS  . docusate sodium  100 mg Oral Daily  . polyethylene glycol  17 g Oral Daily  . prenatal multivitamin  1 tablet Oral Q1200  . sodium chloride  3 mL Intravenous Q12H   I have reviewed the patient's current medications.  ASSESSMENT: Patient Active Problem List   Diagnosis Date Noted  . Premature labor with rupture of membranes 03/05/2014  . PROM (premature rupture of membranes) 03/04/2014  . Placenta previa centralis 03/03/2014    PLAN: Keep antibiotics for latency NICU consult at  BMZ course to start today Watch for worsening in vaginal bleeding  Continue monitoring  for signs/symptoms of labor Continue routine antenatal care.   Macaria Bias 03/08/2014,7:21 AM

## 2014-03-08 NOTE — Progress Notes (Signed)
Jillian Carlson is very reserved, but reports that she is doing okay.  This hospital admission was totally unexpected and she is having a hard time with being away from her family right now, but she reports that she has good support from her husband and from her friends and church family.  Her husband is a Education officer, environmental of a church.    She is aware that we continue to be there for her for support.  We will follow up as we are able.  [address] Pager, [phone] 10:34 AM   03/08/14 1000  Clinical Encounter Type  Visited With Patient  Visit Type Spiritual support;Initial

## 2014-03-09 NOTE — Progress Notes (Signed)
Patient ID: Jillian Carlson, female   DOB: 1975/09/11, 38 y.o.   MRN: [id number]  FACULTY PRACTICE ANTEPARTUM NOTE  Jillian Carlson is a 38 y.o. [id number] at [redacted]w[redacted]d  who is admitted for rupture of membranes and vaginal bleeding in the setting of complete placenta previa.   Fetal presentation is unsure. Length of Stay:  5  Days  Subjective:  Patient reports good fetal movement.  She reports no uterine contractions, no bleeding and mild leaking of fluid per vagina.  Vitals:  Blood pressure 99/50, pulse 62, temperature 98.5 F (36.9 C), temperature source Oral, resp. rate 18, height 4' 11.75" (1.518 m), weight 73.029 kg (161 lb), last menstrual period 09/29/2013, SpO2 100.00%. Physical Examination:  General appearance - alert, well appearing, and in no distress Abdomen - soft, nontender, nondistended, no masses or organomegaly Fundal Height:  size equals dates Extremities: extremities normal, atraumatic, no cyanosis or edema  Membranes:intact, ruptured  Fetal Monitoring:  Baseline: 161 bpm  Labs:  No results found for this or any previous visit (from the past 24 hour(s)).  Imaging Studies:       Medications:  Scheduled . amoxicillin  500 mg Oral Q8H  . azithromycin  500 mg Oral QHS  . betamethasone acetate-betamethasone sodium phosphate  12 mg Intramuscular Q24H  . docusate sodium  100 mg Oral Daily  . polyethylene glycol  17 g Oral Daily  . prenatal multivitamin  1 tablet Oral Q1200  . sodium chloride  3 mL Intravenous Q12H   I have reviewed the patient's current medications.  ASSESSMENT: Patient Active Problem List   Diagnosis Date Noted  . Premature labor with rupture of membranes 03/05/2014  . PROM (premature rupture of membranes) 03/04/2014  . Placenta previa centralis 03/03/2014    PLAN: Continue latency antibiotics.  Second dose of betamethasone today. Continue routine antenatal care.   Jillian Carlson 03/09/2014,7:40 AM

## 2014-03-09 NOTE — Progress Notes (Signed)
I stopped by patient room to check her needs, by Orlan Leavens

## 2014-03-10 ENCOUNTER — Inpatient Hospital Stay (HOSPITAL_COMMUNITY): Payer: Medicaid Other

## 2014-03-10 LAB — TYPE AND SCREEN
ABO/RH(D): A POS
Antibody Screen: NEGATIVE

## 2014-03-10 NOTE — Progress Notes (Signed)
Patient ID: Jillian Carlson, female   DOB: 02-02-1976, 38 y.o.   MRN: [id number] Jillian Heritage, DO Physician Signed Obstetrics Progress Notes Service date: 03/09/2014 7:40 AM   Patient ID: Jillian Carlson, female DOB: 06/02/1976, 38 y.o. MRN: [id number]  FACULTY PRACTICE ANTEPARTUM NOTE  Jillian Carlson is a 38 y.o. [id number] at [redacted]w[redacted]d who is admitted for rupture of membranes and vaginal bleeding in the setting of complete placenta previa.  Fetal presentation is unsure.  Length of Stay: 6 Days  Subjective:  Patient reports good fetal movement. She reports no uterine contractions, no bleeding and mild leaking of fluid per vagina.   Physical Examination:  Filed Vitals:   03/09/14 2023 03/09/14 2024 03/09/14 2330 03/09/14 2331  BP:  121/50 106/40   Pulse: 84 90 68 66  Temp:  98.6 F (37 C) 98.3 F (36.8 C)   TempSrc:  Oral Oral   Resp:  20 18   Height:      Weight:      SpO2:  99%  98%    General appearance - alert, well appearing, and in no distress  Abdomen - soft, nontender, nondistended, no masses or organomegaly  Fundal Height: size equals dates  Extremities: extremities normal, atraumatic, no cyanosis or edema  Membranes:intact, ruptured  Fetal Monitoring: Baseline: 153 Labs:  No results found for this or any previous visit (from the past 24 hour(s)).  Imaging Studies:  Medications: Scheduled    .  amoxicillin  500 mg  Oral  Q8H    .  azithromycin  500 mg  Oral  QHS    .  betamethasone acetate-betamethasone sodium phosphate  12 mg  Intramuscular  Q24H    .  docusate sodium  100 mg  Oral  Daily    .  polyethylene glycol  17 g  Oral  Daily    .  prenatal multivitamin  1 tablet  Oral  Q1200    .  sodium chloride  3 mL  Intravenous  Q12H     I have reviewed the patient's current medications.  ASSESSMENT:    Patient Active Problem List     Diagnosis  Date Noted    .  Premature labor with rupture of membranes  03/05/2014    .  PROM (premature rupture of  membranes)  03/04/2014    .  Placenta previa centralis  03/03/2014     PLAN:  Continue latency antibiotics. Second dose of betamethasone given  Continue routine antenatal care.  Adam Phenix, MD 03/10/2014 7:32 AM

## 2014-03-10 NOTE — Consult Note (Signed)
Neonatology Consult to Antenatal Patient:  I was asked by Dr. Debroah Loop to see this patient in order to provide antenatal counseling due to PPROM, now at 23 0/7 weeks.  Jillian Carlson was admitted on 9/17 due to SROM. She is now at 23 0/[redacted] weeks GA and has received 2 doses of Betamethasone and latency antibiotics. She is currently not having active labor. The fetus is female.  I spoke with the patient alone with Spanish interpreter Eda Royal. First, I explained, in simple terms, how the fetal lung develops, and why 23 weeks is the limit of viability. I emphasized that infants born between 23-24 weeks may not respond to resuscitative efforts due to a lack of alveolar development. This is especially true when there has been little amniotic fluid present. I let her know that, in the absence of amniotic fluid, the baby's lungs will not develop any further than they were on 9/17, which could have implications for survival even with delivery at 24+ weeks. We discussed the worst case of delivery in the next 1-2 days, including usual DR management, possible respiratory complications and need for support, IV access, LOS, Mortality and Morbidity, and long term outcomes. She did not have any questions at this time. I offered a NICU tour to any interested family members and would be glad to come back if she has more questions later.  Thank you for asking me to see this patient.  Doretha Sou, MD Neonatologist  The total length of face-to-face or floor/unit time for this encounter was 25 minutes. Counseling and/or coordination of care was 20 minutes of the above.

## 2014-03-10 NOTE — Progress Notes (Signed)
Antenatal Nutrition Assessment:  Currently  23 1/[redacted] weeks gestation, with PROM. Height  59 "  Weight 161 lbs  pre-pregnancy weight 147 lbs .  Pre-pregnancy  BMI 29.8  IBW 90-100 lbs Total weight gain 14.lbs Weight gain goals 15-25 lbs Estimated needs: 1600-1700 kcal/day, 60-70 grams protein/day, 1.8 liters fluid/day  Regular diet Current diet prescription will provide for increased needs.  Nutrition related labs  Hemoglobin & Hematocrit     Component Value Date/Time   HGB 9.3* 03/04/2014 1854   HCT 27.5* 03/04/2014 1854    Nutrition Dx: Increased nutrient needs r/t pregnancy and fetal growth requirements aeb [redacted] weeks gestation.  No educational needs assessed at this time.  Jillian Carlson LDN Neonatal Nutrition Support Specialist/RD III Pager [phone]

## 2014-03-11 ENCOUNTER — Inpatient Hospital Stay (HOSPITAL_COMMUNITY): Payer: Medicaid Other

## 2014-03-11 NOTE — Progress Notes (Signed)
Ur chart review completed.  

## 2014-03-11 NOTE — Plan of Care (Signed)
Problem: Consults Goal: Teacher, music (Diversional Activities, Age Appropriate) Outcome: Progressing Instructed patient on activities for prolonged hospitalization. Patient receptive.  Problem: Phase II Progression Outcomes Goal: Medications as ordered (see description) Medications as ordered (Magnesium Sulfate, Steroids, PO Tocolysis, Antibiotics, Terbutaline Pump, Other)  Outcome: Progressing Instructed patient on betamethasone injections, purpose of medication. Patient receptive to instructions.

## 2014-03-11 NOTE — Progress Notes (Signed)
Patient ID: Jillian Carlson, female   DOB: 08-27-1975, 38 y.o.   MRN: [id number] FACULTY PRACTICE ANTEPARTUM(COMPREHENSIVE) NOTE  Jillian Carlson is a 38 y.o. [id number] at [redacted]w[redacted]d by LMP who is admitted for rupture of membranes.   Fetal presentation is breech. Length of Stay:  7  Days  Subjective: Pt. Denies LOF since 1 week ago. No bleeding Patient reports the fetal movement as active. Patient reports uterine contraction  activity as none. Patient reports  vaginal bleeding as none. Patient describes fluid per vagina as None.  Vitals:  Blood pressure 104/45, pulse 64, temperature 98.5 F (36.9 C), temperature source Oral, resp. rate 18, height 4' 11.75" (1.518 m), weight 161 lb (73.029 kg), last menstrual period 09/29/2013, SpO2 98.00%. Physical Examination:  General appearance - alert, well appearing, and in no distress Abdomen - gravid, NT Fundal Height:  size equals dates Extremities: extremities normal, atraumatic, no cyanosis or edema  Membranes: ruptured  Fetal Monitoring:  Baseline: 150 bpm, Variability: Good {> 6 bpm), Accelerations: Non-reactive but appropriate for gestational age and Decelerations: Absent  Labs:  Results for orders placed during the hospital encounter of 03/04/14 (from the past 24 hour(s))  TYPE AND SCREEN   Collection Time    03/10/14 12:57 PM      Result Value Ref Range   ABO/RH(D) A POS     Antibody Screen NEG     Sample Expiration 03/13/2014      Medications:  Scheduled . amoxicillin  500 mg Oral Q8H  . docusate sodium  100 mg Oral Daily  . polyethylene glycol  17 g Oral Daily  . prenatal multivitamin  1 tablet Oral Q1200  . sodium chloride  3 mL Intravenous Q12H   I have reviewed the patient's current medications.  ASSESSMENT: Patient Active Problem List   Diagnosis Date Noted  . Premature labor with rupture of membranes 03/05/2014  . PROM (premature rupture of membranes) 03/04/2014  . Placenta previa centralis 03/03/2014     PLAN: MFM consult today-- Delivery with s/sx's of chorio Could consider amnisure if remains without LOF  Jalon Blackwelder S, MD 03/11/2014,11:07 AM

## 2014-03-11 NOTE — Consult Note (Signed)
Maternal Fetal Medicine Consultation  Requesting Provider(s): Duane Lope, MD  Reason for consultation: Complete central previa, PROM at 23w 2d   HPI: Jillian Carlson is a 38 yo [phone] currently at [redacted]w[redacted]d who is admitted due to PROM and complete/ central placenta previa.  The patient presented to [facility] on 9/17 with complaints of leakage of fluid and some vaginal bleeding.  Ultrasound showed new-onset oligohydramnios.  She has completed a course of latency antibiotics and betamethasone.  Since admission, she has not experienced any additional vaginal bleeding and has been stable on bedrest.  Ultrasound on the night of admission showed an estimated fetal weight of 540 g.  OB History:  OB History  Gravida Para Term Preterm AB SAB TAB Ectopic Multiple Living  # Outcome Date GA Lbr Len/2nd Weight Sex Delivery Anes PTL Lv  8 CUR           7 SAB 08/18/13          6 TRM 10/12/03    M SVD   Y  5 TRM 08/29/98    F SVD   Y  4 PRE 05/14/97 [redacted]w[redacted]d   M SVD   Y  3 TRM 04/01/96    M SVD   Y  2 TRM 02/15/94    M SVD   ND  1 TRM 04/12/92    F SVD   Y      PMH:  Past Medical History  Diagnosis Date  . Vaginal Pap smear, abnormal     PSH:  Past Surgical History  Procedure Laterality Date  . Dilation and evacuation N/A 08/18/2013    Procedure: DILATATION AND EVACUATION;  Surgeon: Catalina Antigua, MD;  Location: [facility];  Service: Gynecology;  Laterality: N/A;   Meds: Scheduled Meds: . amoxicillin  500 mg Oral Q8H  . docusate sodium  100 mg Oral Daily  . polyethylene glycol  17 g Oral Daily  . prenatal multivitamin  1 tablet Oral Q1200  . sodium chloride  3 mL Intravenous Q12H   Continuous Infusions:  PRN Meds:.sodium chloride, acetaminophen, calcium carbonate, sodium chloride, zolpidem  Allergies: No Known Allergies  FH: History reviewed. No pertinent family history.  Soc:  History   Social History  . Marital Status: Married    Spouse Name: N/A    Number of  Children: N/A  . Years of Education: N/A   Occupational History  . Not on file.   Social History Main Topics  . Smoking status: Never Smoker   . Smokeless tobacco: Not on file  . Alcohol Use: No  . Drug Use: No  . Sexual Activity: Not Currently    Birth Control/ Protection: None   Other Topics Concern  . Not on file   Social History Narrative  . No narrative on file    Review of Systems: no vaginal bleeding or cramping/contractions, no LOF, no nausea/vomiting. All other systems reviewed and are negative.  PNL:   PE:   Filed Vitals:   03/11/14 1200  BP: 100/46  Pulse: 76  Temp: 98.2 F (36.8 C)  Resp: 18    GEN: well-appearing female ABD: gravid, NT  Please see separate document for fetal ultrasound report.  Labs: CBC    Component Value Date/Time   WBC 9.5 03/04/2014 1854   RBC 3.10* 03/04/2014 1854   HGB 9.3* 03/04/2014 1854   HCT 27.5* 03/04/2014 1854   PLT 206 03/04/2014 1854  MCV 88.7 03/04/2014 1854   MCH 30.0 03/04/2014 1854   MCHC 33.8 03/04/2014 1854   RDW 13.6 03/04/2014 1854     A/P: 1) Single IUP at 23w 2d         2) PROM - s/p latency antibiotics and betamethasone.  While it would seem unusual to experience ruptured membranes with a complete placenta previa, the patient does indeed give a very good story and with the new onset oligohydramnios would seem to be the most logical explanation.  While there may be a role for repeating amnisure test, I am not sure that I would rely on this alone to rule out ruptured membranes - may consider repeating AFI in addition to amnisure if there is any question.         3) Complete / central placenta previa - had brief discussion with the patient regarding route of delivery.  At this gestation, would clearly recommend Cesarean delivery due to complete previa.  We briefly discussed the risks of cesarean hysterectomy and hemorrhage - possible need for blood products.  Would recommend adding Iron supplements given current  Hb of 9.3.  Recommendations: 1) Would consider given a second course of betamethasone 2-3 weeks after her initial dose if undelivered 2) Serial growth scans every 3 weeks while hospitalized 3) Expectant management - would move toward delivery for heavy vaginal bleeding, infection, labor or non-reassuring fetal testing. 4) If delivery appears imminent, consider course of Magnesium sulfate for neuroprotection 5) Iron supplements (see above)   Thank you for the opportunity to be a part of the care of Jillian Carlson. Please contact our office if we can be of further assistance.   I spent approximately 30 minutes with this patient with over 50% of time spent in face-to-face counseling.  Alpha Gula, MD Maternal Fetal Medicine

## 2014-03-12 NOTE — Progress Notes (Signed)
Dr. Macon Large notified of FHR prolonged decel over three minutes to nadir of  60 bpm for 40 sec with subsequent return to baseline. Also requested order to change FM order from NST BID to 30 mins BID. Order received.

## 2014-03-12 NOTE — Progress Notes (Signed)
I stopped by patients room several times and check her needs. By Orlan Leavens

## 2014-03-13 ENCOUNTER — Inpatient Hospital Stay (HOSPITAL_COMMUNITY): Payer: Medicaid Other

## 2014-03-13 LAB — TYPE AND SCREEN
ABO/RH(D): A POS
Antibody Screen: NEGATIVE

## 2014-03-13 MED ORDER — SENNOSIDES-DOCUSATE SODIUM 8.6-50 MG PO TABS
2.0000 | ORAL_TABLET | Freq: Every day | ORAL | Status: DC
  Administered 2014-03-13 – 2014-03-28 (×15): 2 via ORAL
  Filled 2014-03-13 (×15): qty 2

## 2014-03-13 MED ORDER — SODIUM CHLORIDE 0.9 % IV SOLN
510.0000 mg | Freq: Once | INTRAVENOUS | Status: AC
  Administered 2014-03-13: 510 mg via INTRAVENOUS
  Filled 2014-03-13: qty 17

## 2014-03-13 NOTE — Progress Notes (Signed)
Patient ID: Jillian Carlson, female   DOB: 09/16/1975, 38 y.o.   MRN: [id number] FACULTY PRACTICE ANTEPARTUM(COMPREHENSIVE) NOTE  Zoria Rawlinson is a 38 y.o. [id number] at [redacted]w[redacted]d by early ultrasound who is admitted for PROM, placenta previa,.   Fetal presentation is breech. Length of Stay:  9  Days  Subjective: Pt denies any bleeding or fluid loss per vagina.  Patient reports the fetal movement as active. Patient reports uterine contraction  activity as none. Patient reports  vaginal bleeding as none. Patient describes fluid per vagina as None.  Vitals:  Blood pressure 88/42, pulse 67, temperature 98.2 F (36.8 C), temperature source Oral, resp. rate 16, height 4' 11.75" (1.518 m), weight 73.029 kg (161 lb), last menstrual period 09/29/2013, SpO2 100.00%. Physical Examination:  General appearance - alert, well appearing, and in no distress, oriented to person, place, and time and overweight Heart - normal rate and regular rhythm Abdomen - soft, nontender, nondistended Fundal Height:  size equals dates uterus nontender Cervical Exam: Not evaluated. An Extremities: extremities normal, atraumatic, no cyanosis or edema and Homans sign is negative, no sign of DVT with DTRs 2+ bilaterally Membranes:ruptured  Fetal Monitoring: no uterine activity, normal fhr for gest age.   Labs:  No results found for this or any previous visit (from the past 24 hour(s)).  Imaging Studies:     Currently EPIC will not allow sonographic studies to automatically populate into notes.  In the meantime, copy and paste results into note or free text.  Medications:  Scheduled . docusate sodium  100 mg Oral Daily  . polyethylene glycol  17 g Oral Daily  . prenatal multivitamin  1 tablet Oral Q1200  . senna-docusate  2 tablet Oral QHS  . sodium chloride  3 mL Intravenous Q12H   I have reviewed the patient's current medications.  ASSESSMENT: Patient Active Problem List   Diagnosis Date Noted  .  Premature labor with rupture of membranes 03/05/2014  . PROM (premature rupture of membranes) 03/04/2014  . Placenta previa centralis 03/03/2014    PLAN: inpt care longterm, anticipate u/s Monday to check if fluid recollecting.  Titus Drone V 03/13/2014,9:22 AM

## 2014-03-14 NOTE — Progress Notes (Signed)
Patient ID: Jillian Carlson, female   DOB: January 03, 1976, 38 y.o.   MRN: [id number] ACULTY PRACTICE ANTEPARTUM COMPREHENSIVE PROGRESS NOTE  Jillian Carlson is a 38 y.o. [id number] at [redacted]w[redacted]d  who is admitted for PPROM and complete previa.   Fetal presentation is unsure. Length of Stay:  10  Days  Subjective: Pt reports continued pinkish discharge.  No frank bleeding Patient reports good fetal movement.  She reports no uterine contractions.  Vitals:  Blood pressure 98/47, pulse 62, temperature 98.3 F (36.8 C), temperature source Oral, resp. rate 16, height 4' 11.75" (1.518 m), weight 161 lb (73.029 kg), last menstrual period 09/29/2013, SpO2 100.00%. Physical Examination: General appearance - alert, well appearing, and in no distress Chest - clear to auscultation, no wheezes, rales or rhonchi, symmetric air entry Heart - normal rate, regular rhythm, normal S1, S2, no murmurs, rubs, clicks or gallops Abdomen - soft, nontender, nondistended, no masses or organomegaly gravid Extremities - peripheral pulses normal, no pedal edema, no clubbing or cyanosis Cervical Exam: Not evaluated.  Membranes:ruptured  Fetal Monitoring:  Baseline: 150's bpm, Variability: Fair (1-6 bpm) and Decelerations: Absent  Labs:  Results for orders placed during the hospital encounter of 03/04/14 (from the past 24 hour(s))  TYPE AND SCREEN   Collection Time    03/13/14 11:54 AM      Result Value Ref Range   ABO/RH(D) A POS     Antibody Screen NEG     Sample Expiration 03/16/2014      Imaging Studies:    sono done last night.  Results pending   Medications:  Scheduled . docusate sodium  100 mg Oral Daily  . polyethylene glycol  17 g Oral Daily  . prenatal multivitamin  1 tablet Oral Q1200  . senna-docusate  2 tablet Oral QHS  . sodium chloride  3 mL Intravenous Q12H   I have reviewed the patient's current medications.  ASSESSMENT: Patient Active Problem List   Diagnosis Date Noted  . Premature  labor with rupture of membranes 03/05/2014  . PROM (premature rupture of membranes) 03/04/2014  . Placenta previa centralis 03/03/2014    PLAN: F/u US from last pm Deliver for maternal or fetal indications  Continue routine antenatal care.   HARRAWAY-SMITH, Jillian Carlson 03/14/2014,7:57 AM

## 2014-03-14 NOTE — Progress Notes (Signed)
I visited with patient several times today to order her meals and assist RN with interpretations as needed. Spanish Interpreter: Joselyn Glassman

## 2014-03-15 NOTE — Progress Notes (Signed)
Ur chart review completed.  

## 2014-03-15 NOTE — Plan of Care (Signed)
Problem: Phase I Progression Outcomes Goal: Hemodynamically stable Outcome: Progressing Anemia requiring Iron IV.

## 2014-03-15 NOTE — Progress Notes (Signed)
Patient ID: Jillian Carlson, female   DOB: 06/27/1975, 38 y.o.   MRN: [id number] FACULTY PRACTICE ANTEPARTUM(COMPREHENSIVE) NOTE  Jennesis Ramaswamy is a 38 y.o. [id number] at [redacted]w[redacted]d by early ultrasound who is admitted for PROM, central previa.   Fetal presentation is breech. Length of Stay:  11  Days  Subjective: No complaints. Patient reports the fetal movement as active. Patient reports uterine contraction  activity as none. Patient reports  vaginal bleeding as none. Patient describes fluid per vagina as None. She DID have a lite d/c yesterday morning briefly, none since. She specifically denies contractions.  Vitals:  Blood pressure 97/48, pulse 68, temperature 98.6 F (37 C), temperature source Oral, resp. rate 18, height 4' 11.75" (1.518 m), weight 73.029 kg (161 lb), last menstrual period 09/29/2013, SpO2 100.00%. Physical Examination:  General appearance - alert, well appearing, and in no distress Heart - normal rate and regular rhythm Abdomen - soft, nontender, nondistended Fundal Height:  size less than dates Cervical Exam: Not evaluated. And fetal presentation is breech and confirmed on u/s Saturday evening, where fluid AFI was still oligohydramnios, 1 cm AFI. Extremities: extremities normal, atraumatic, no cyanosis or edema and Homans sign is negative, no sign of DVT with DTRs 2+ bilaterally Membranes:ruptured  Fetal Monitoring:  Monitored q shift  Labs:  No results found for this or any previous visit (from the past 24 hour(s)).  Imaging Studies:     Currently EPIC will not allow sonographic studies to automatically populate into notes.  In the meantime, copy and paste results into note or free text.  Medications:  Scheduled . docusate sodium  100 mg Oral Daily  . polyethylene glycol  17 g Oral Daily  . prenatal multivitamin  1 tablet Oral Q1200  . senna-docusate  2 tablet Oral QHS  . sodium chloride  3 mL Intravenous Q12H   received Ferraheme on 9/26 I have  reviewed the patient's current medications.  ASSESSMENT: Patient Active Problem List   Diagnosis Date Noted  . Premature labor with rupture of membranes 03/05/2014  . PROM (premature rupture of membranes) 03/04/2014  . Placenta previa centralis 03/03/2014    PLAN: Continued inpt care. q 72 hr T&S Would require cesarean delivery  Amarilys Lyles V 03/15/2014,6:32 AM

## 2014-03-15 NOTE — Progress Notes (Signed)
I assisted  Amy,RN with questions. Eda H Royal   Interpreter. °

## 2014-03-16 LAB — TYPE AND SCREEN
ABO/RH(D): A POS
Antibody Screen: NEGATIVE

## 2014-03-16 NOTE — Plan of Care (Signed)
Problem: Phase I Progression Outcomes Goal: LOS < 4 days Outcome: Not Progressing Patient here for prolonged hospitalization due to PROM

## 2014-03-16 NOTE — Progress Notes (Addendum)
FACULTY PRACTICE ANTEPARTUM NOTE  Jillian Carlson is a 38 y.o. [id number] at 6026w0d  who is admitted for rupture of membranes.   Fetal presentation is unsure. Length of Stay:  12  Days  Subjective:  Patient reports good fetal movement.  She reports no uterine contractions, no bleeding.  Continues to leak small amount of pink colored fluid.  No other concerns.  Vitals:  Blood pressure 95/47, pulse 72, temperature 98.2 F (36.8 C), temperature source Oral, resp. rate 16, height 4' 11.75" (1.518 m), weight 73.029 kg (161 lb), last menstrual period 09/29/2013, SpO2 100.00%. Physical Examination:  General appearance - alert, well appearing, and in no distress Abdomen - soft, nontender, nondistended, no masses or organomegaly Neurological - alert, oriented, normal speech, no focal findings or movement disorder noted Musculoskeletal - no joint tenderness, deformity or swelling Fundal Height:  size equals dates Extremities: extremities normal, atraumatic, no cyanosis or edema  Membranes:intact, ruptured  Fetal Monitoring:  Baseline: 150 bpm  Labs:  No results found for this or any previous visit (from the past 24 hour(s)).  Imaging Studies:    none   Medications:  Scheduled . docusate sodium  100 mg Oral Daily  . polyethylene glycol  17 g Oral Daily  . prenatal multivitamin  1 tablet Oral Q1200  . senna-docusate  2 tablet Oral QHS  . sodium chloride  3 mL Intravenous Q12H   I have reviewed the patient's current medications.  ASSESSMENT: Patient Active Problem List   Diagnosis Date Noted  . Premature labor with rupture of membranes 03/05/2014  . PROM (premature rupture of membranes) 03/04/2014  . Placenta previa centralis 03/03/2014    PLAN: Here for the duration of her pregnancy.   NSTs twice daily. Continue routine antenatal care.   STINSON, JACOB JEHIEL 03/16/2014,7:43 AM

## 2014-03-17 ENCOUNTER — Encounter: Payer: Self-pay | Admitting: *Deleted

## 2014-03-17 NOTE — Progress Notes (Signed)
Patient ID: Jillian PassySusana Carlson, female   DOB: 04/06/1976, 38 y.o.   MRN: [id number] FACULTY PRACTICE ANTEPARTUM NOTE  Jillian Carlson is a 38 y.o. [id number] at 2764w1d  who is admitted for rupture of membranes.   Fetal presentation is unsure. Length of Stay:  13  Days  Subjective:  Patient reports good fetal movement.  She reports no uterine contractions, no bleeding.  Continues to leak small amount of pink colored fluid.  No other concerns.  Vitals:  Blood pressure 95/41, pulse 68, temperature 98.3 F (36.8 C), temperature source Oral, resp. rate 18, height 4' 11.75" (1.518 m), weight 161 lb (73.029 kg), last menstrual period 09/29/2013, SpO2 100.00%. Physical Examination:  General appearance - alert, well appearing, and in no distress Abdomen - soft, nontender,gravid Extremities: extremities normal, atraumatic, no cyanosis or edema  Membranes:intact, ruptured  Fetal Monitoring:  Baseline: 150 bpm Toco: no contractions  Labs:  Results for orders placed during the hospital encounter of 03/04/14 (from the past 24 hour(s))  TYPE AND SCREEN   Collection Time    03/16/14 12:21 PM      Result Value Ref Range   ABO/RH(D) A POS     Antibody Screen NEG     Sample Expiration 03/19/2014      Imaging Studies:    none   Medications:  Scheduled . docusate sodium  100 mg Oral Daily  . polyethylene glycol  17 g Oral Daily  . prenatal multivitamin  1 tablet Oral Q1200  . senna-docusate  2 tablet Oral QHS  . sodium chloride  3 mL Intravenous Q12H   I have reviewed the patient's current medications.  ASSESSMENT: Patient Active Problem List   Diagnosis Date Noted  . Premature labor with rupture of membranes 03/05/2014  . PROM (premature rupture of membranes) 03/04/2014  . Placenta previa centralis 03/03/2014    PLAN: Continue monitoring for si/sx of chorioamnionitis Latency antibiotics conpleted.   NSTs twice daily. Continue routine antenatal  care.   Jillian Carlson 03/17/2014,6:24 AM

## 2014-03-18 ENCOUNTER — Encounter: Payer: Medicaid Other | Admitting: Obstetrics & Gynecology

## 2014-03-18 DIAGNOSIS — O4412 Placenta previa with hemorrhage, second trimester: Secondary | ICD-10-CM

## 2014-03-18 DIAGNOSIS — O42912 Preterm premature rupture of membranes, unspecified as to length of time between rupture and onset of labor, second trimester: Secondary | ICD-10-CM

## 2014-03-18 NOTE — Progress Notes (Signed)
I stopped by patient room to check her need. By Orlan LeavensViria Alvarez , Spanish Interpreter

## 2014-03-18 NOTE — Progress Notes (Signed)
Stopped by to check on patient's needs. Eda H Royal Interpreter. °

## 2014-03-19 DIAGNOSIS — O4412 Placenta previa with hemorrhage, second trimester: Secondary | ICD-10-CM

## 2014-03-19 DIAGNOSIS — O42912 Preterm premature rupture of membranes, unspecified as to length of time between rupture and onset of labor, second trimester: Principal | ICD-10-CM

## 2014-03-19 LAB — TYPE AND SCREEN
ABO/RH(D): A POS
ANTIBODY SCREEN: NEGATIVE

## 2014-03-19 NOTE — Progress Notes (Signed)
Patient ID: Jillian Carlson, female   DOB: 07/16/1975, 38 y.o.   MRN: [id number] FACULTY PRACTICE ANTEPARTUM NOTE  Jillian Carlson is a 38 y.o. [id number] at 4829w2d  who is admitted for rupture of membranes.   Fetal presentation is unsure.  Length of Stay:  14  Days  Subjective: Patient is Spanish-speaking only, Spanish interpreter present for this encounter. Patient reports good fetal movement.  She reports no uterine contractions, no bleeding.  Continues to leak small amount of pink colored fluid.  No other concerns.  Vitals:  Blood pressure 96/37, pulse 68, temperature 98.2 F (36.8 C), temperature source Oral, resp. rate 18, height 4' 11.75" (1.518 m), weight 159 lb 6.4 oz (72.303 kg), last menstrual period 09/29/2013, SpO2 100.00%. Physical Examination:  General appearance - alert, well appearing, and in no distress Abdomen - soft, nontender,gravid Extremities: extremities normal, atraumatic, no cyanosis or edema  Membranes:intact, ruptured  Fetal Monitoring:  Baseline: 150 bpm Toco: no contractions  Labs:  No results found for this or any previous visit (from the past 24 hour(s)).  Imaging Studies:    none   Medications:  Scheduled . docusate sodium  100 mg Oral Daily  . polyethylene glycol  17 g Oral Daily  . prenatal multivitamin  1 tablet Oral Q1200  . senna-docusate  2 tablet Oral QHS  . sodium chloride  3 mL Intravenous Q12H   I have reviewed the patient's current medications.  ASSESSMENT: Patient Active Problem List   Diagnosis Date Noted  . Premature labor with rupture of membranes 03/05/2014  . PROM (premature rupture of membranes) 03/04/2014  . Placenta previa centralis 03/03/2014    PLAN: Continue monitoring for si/sx of chorioamnionitis Latency antibiotics conpleted.   NSTs twice daily. Continue routine antenatal care.   Tereso NewcomerUgonna A Meital Riehl, MD

## 2014-03-19 NOTE — Plan of Care (Signed)
Problem: Phase I Progression Outcomes Goal: LOS < 4 days Outcome: Not Progressing Patient is admitted for long term hospitalization due to PROM.

## 2014-03-19 NOTE — Progress Notes (Signed)
Patient ID: Jillian Carlson, female   DOB: 08/19/1975, 38 y.o.   MRN: [id number] FACULTY PRACTICE ANTEPARTUM NOTE  Jillian Carlson is a 38 y.o. [id number] at 6022w3d  who is admitted for rupture of membranes.   Fetal presentation is unsure.  Length of Stay:  15  Days  Subjective: Patient is Spanish-speaking only, Spanish interpreter present for this encounter. Patient reports good fetal movement.  She reports no uterine contractions, no bleeding.  Continues to leak small amount of pink colored fluid.  No other concerns.  Vitals:  Blood pressure 96/37, pulse 68, temperature 98.2 F (36.8 C), temperature source Oral, resp. rate 18, height 4' 11.75" (1.518 m), weight 159 lb 6.4 oz (72.303 kg), last menstrual period 09/29/2013, SpO2 100.00%. Physical Examination:  General appearance - alert, well appearing, and in no distress Abdomen - soft, nontender,gravid Extremities: extremities normal, atraumatic, no cyanosis or edema  Membranes:intact, ruptured  Fetal Monitoring:  Baseline: 150 bpm Toco: no contractions  Labs:  No results found for this or any previous visit (from the past 24 hour(s)).  Imaging Studies:    none   Medications:  Scheduled . docusate sodium  100 mg Oral Daily  . polyethylene glycol  17 g Oral Daily  . prenatal multivitamin  1 tablet Oral Q1200  . senna-docusate  2 tablet Oral QHS  . sodium chloride  3 mL Intravenous Q12H   I have reviewed the patient's current medications.  ASSESSMENT: Patient Active Problem List   Diagnosis Date Noted  . Premature labor with rupture of membranes 03/05/2014  . PROM (premature rupture of membranes) 03/04/2014  . Placenta previa centralis 03/03/2014    PLAN: Continue monitoring for si/sx of chorioamnionitis Latency antibiotics conpleted.   NSTs twice daily. Continue routine antenatal care.  Jillian Carlson A, MD 03/19/2014,8:09 AM

## 2014-03-19 NOTE — Progress Notes (Signed)
UR completed 

## 2014-03-20 DIAGNOSIS — O09522 Supervision of elderly multigravida, second trimester: Secondary | ICD-10-CM

## 2014-03-20 DIAGNOSIS — O42912 Preterm premature rupture of membranes, unspecified as to length of time between rupture and onset of labor, second trimester: Secondary | ICD-10-CM

## 2014-03-20 DIAGNOSIS — Z3A22 22 weeks gestation of pregnancy: Secondary | ICD-10-CM

## 2014-03-20 DIAGNOSIS — O4412 Placenta previa with hemorrhage, second trimester: Secondary | ICD-10-CM

## 2014-03-20 NOTE — Progress Notes (Signed)
Assisted RN with interpretation and ordering dinner and breakfast. Joselyn GlassmanBenita Sanchez - Spanish Inerpreter

## 2014-03-20 NOTE — Progress Notes (Signed)
Assisted RN with interpretation °Benita Sanchez - Spanish Interpreter °

## 2014-03-20 NOTE — Progress Notes (Signed)
Patient ID: Jillian Carlson, female   DOB: 08/11/1975, 38 y.o.   MRN: [id number] FACULTY PRACTICE ANTEPARTUM(COMPREHENSIVE) NOTE  Jillian Carlson is a 38 y.o. [id number] at 6864w4d by LMP who is admitted for rupture of membranes, placenta previa with bleeding.   Fetal presentation is breech. Length of Stay:  16  Days  Subjective: Fluid is clear now Patient reports the fetal movement as active. Patient reports uterine contraction  activity as none. Patient reports  vaginal bleeding as none. Patient describes fluid per vagina as Clear.  Vitals:  Blood pressure 105/38, pulse 74, temperature 98.3 F (36.8 C), temperature source Oral, resp. rate 20, height 4' 11.75" (1.518 m), weight 72.303 kg (159 lb 6.4 oz), last menstrual period 09/29/2013, SpO2 100.00%. Physical Examination:  General appearance - alert, well appearing, and in no distress Heart - normal rate and regular rhythm Abdomen - soft, nontender, nondistended Fundal Height:  size equals dates Cervical Exam: Not evaluated. Extremities: extremities normal, atraumatic, no cyanosis or edema and Homans sign is negative Membranes:ruptured  Fetal Monitoring:  Baseline: 140 bpm, Variability: Good {> 6 bpm), Accelerations: Reactive and Decelerations: Absent  Labs:  Results for orders placed during the hospital encounter of 03/04/14 (from the past 24 hour(s))  TYPE AND SCREEN   Collection Time    03/19/14 12:00 PM      Result Value Ref Range   ABO/RH(D) A POS     Antibody Screen NEG     Sample Expiration 03/22/2014      Medications:  Scheduled . docusate sodium  100 mg Oral Daily  . polyethylene glycol  17 g Oral Daily  . prenatal multivitamin  1 tablet Oral Q1200  . senna-docusate  2 tablet Oral QHS  . sodium chloride  3 mL Intravenous Q12H   I have reviewed the patient's current medications.  ASSESSMENT: Patient Active Problem List   Diagnosis Date Noted  . Premature labor with rupture of membranes 03/05/2014  . PROM  (premature rupture of membranes) 03/04/2014  . Placenta previa centralis 03/03/2014    PLAN:Continue monitoring for si/sx of chorioamnionitis  Latency antibiotics conpleted.  NSTs twice daily.  Continue routine antenatal care.    Khaleb Broz 03/20/2014,7:40 AM

## 2014-03-21 NOTE — Progress Notes (Signed)
FACULTY PRACTICE ANTEPARTUM COMPREHENSIVE PROGRESS NOTE  Jillian Carlson is a 38 y.o. [id number] at 4440w5d  who is admitted for rupture of membranes with complete previa.  Estimated Date of Delivery: 07/06/14 Fetal presentation is breech.  Length of Stay:  17 Days. 03/04/2014  Subjective:  Patient reports good fetal movement.  She reports denies uterine contractions, no bleeding and no loss of fluid per vagina.  Vitals:  Blood pressure 93/49, pulse 74, temperature 98.4 F (36.9 C), temperature source Oral, resp. rate 18, height 4' 11.75" (1.518 m), weight 159 lb 6.4 oz (72.303 kg), last menstrual period 09/29/2013, SpO2 100.00%. Physical Examination: General appearance - alert, well appearing, and in no distress Mental status - alert, oriented to person, place, and time Abdomen - soft, nontender, nondistended, no masses or organomegaly Extremities - peripheral pulses normal, no pedal edema, no clubbing or cyanosis Skin - normal coloration and turgor, no rashes, no suspicious skin lesions noted Cervical Exam: Not evaluated.   Fetal Monitoring:  Baseline: 150 bpm, Variability: Good {> 6 bpm), Accelerations: Reactive and Decelerations: Absent  No results found for this or any previous visit (from the past 24 hour(s)).  No results found.  . docusate sodium  100 mg Oral Daily  . polyethylene glycol  17 g Oral Daily  . prenatal multivitamin  1 tablet Oral Q1200  . senna-docusate  2 tablet Oral QHS  . sodium chloride  3 mL Intravenous Q12H   I have reviewed the patient's current medications.  ASSESSMENT: Patient Active Problem List   Diagnosis Date Noted  . Premature labor with rupture of membranes 03/05/2014  . PROM (premature rupture of membranes) 03/04/2014  . Placenta previa centralis 03/03/2014    PLAN:  Continue routine antenatal care. S/p Latency abx  NST twice daily  OB Fellow Faculty Practice, [facility]

## 2014-03-21 NOTE — Plan of Care (Signed)
Problem: Phase I Progression Outcomes Goal: Maintains reassuring Fetal Heart Rate Outcome: Progressing Fhr remains reassuring .

## 2014-03-21 NOTE — Progress Notes (Signed)
Checked with RN concerning interpretations necessary Assisted with ordering Dinner and breakfast Jillian Carlson - [employer] Interpreter

## 2014-03-21 NOTE — Progress Notes (Signed)
Assisted RN with medical evaluation and IV placement interpretation. Spanish Interpreter - Joselyn GlassmanBenita Sanchez

## 2014-03-22 DIAGNOSIS — Z3A24 24 weeks gestation of pregnancy: Secondary | ICD-10-CM

## 2014-03-22 LAB — TYPE AND SCREEN
ABO/RH(D): A POS
ANTIBODY SCREEN: NEGATIVE

## 2014-03-22 NOTE — Progress Notes (Signed)
Stopped by to check on patient's needs. Eda H Royal Interpreter. °

## 2014-03-22 NOTE — Progress Notes (Signed)
Ur chart review completed.  

## 2014-03-22 NOTE — Progress Notes (Signed)
Patient ID: Jillian Carlson, female   DOB: 10/02/1975, 38 y.o.   MRN: [id number] FACULTY PRACTICE ANTEPARTUM COMPREHENSIVE PROGRESS NOTE  Jillian Carlson is a 38 y.o. [id number] at 5617w6d  who is admitted for rupture of membranes with complete previa.  Estimated Date of Delivery: 07/06/14 Fetal presentation is breech.  Length of Stay:  18 Days. 03/04/2014  Subjective:  Patient reports good fetal movement.  She reports denies uterine contractions, no bleeding and no loss of fluid per vagina.  Vitals:  Blood pressure 108/49, pulse 78, temperature 98.2 F (36.8 C), temperature source Oral, resp. rate 16, height 4' 11.75" (1.518 m), weight 159 lb 6.4 oz (72.303 kg), last menstrual period 09/29/2013, SpO2 100.00%. Physical Examination: General appearance - alert, well appearing, and in no distress Mental status - alert, oriented to person, place, and time Abdomen - soft, nontender, gravid Extremities - peripheral pulses normal, no pedal edema, no clubbing or cyanosis Skin - normal coloration and turgor, no rashes, no suspicious skin lesions noted Cervical Exam: Not evaluated.   Fetal Monitoring:  Baseline: 150 bpm, Variability: Good {> 6 bpm), Accelerations: Non-reactive but appropriate for gestational age, Decelerations: Absent and Toco: irregular contractions  No results found for this or any previous visit (from the past 24 hour(s)).  No results found.  . docusate sodium  100 mg Oral Daily  . polyethylene glycol  17 g Oral Daily  . prenatal multivitamin  1 tablet Oral Q1200  . senna-docusate  2 tablet Oral QHS  . sodium chloride  3 mL Intravenous Q12H   I have reviewed the patient's current medications.  ASSESSMENT: Patient Active Problem List   Diagnosis Date Noted  . Premature labor with rupture of membranes 03/05/2014  . PROM (premature rupture of membranes) 03/04/2014  . Placenta previa centralis 03/03/2014    PLAN: Continue monitoring for si/sx of  chorioamnionitis Continue routine antenatal care. S/p Latency abx

## 2014-03-23 NOTE — Progress Notes (Signed)
FACULTY PRACTICE ANTEPARTUM COMPREHENSIVE PROGRESS NOTE  Jillian Carlson is a 38 y.o. [id number] at 5927w5d  who is admitted for rupture of membranes with complete previa.  Estimated Date of Delivery: 07/06/14 Fetal presentation is breech.  Length of Stay:  19 Days. 03/04/2014  Subjective:  Patient reports good fetal movement.  She reports denies uterine contractions, no bleeding.  Vitals:  Blood pressure 115/60, pulse 77, temperature 98.5 F (36.9 C), temperature source Oral, resp. rate 18, height 4' 11.75" (1.518 m), weight 159 lb 6.4 oz (72.303 kg), last menstrual period 09/29/2013, SpO2 100.00%.  Physical Examination: General appearance - alert, well appearing, and in no distress Mental status - alert, oriented to person, place, and time Abdomen - soft, nontender, nondistended, no masses or organomegaly Extremities - peripheral pulses normal, no pedal edema, no clubbing or cyanosis Skin - normal coloration and turgor, no rashes, no suspicious skin lesions noted Cervical Exam: Not evaluated.   Fetal Monitoring:  Baseline: 140bpm, Variability: Good {> 6 bpm), Accelerations: Reactive and Decelerations: Absent  No results found for this or any previous visit (from the past 24 hour(s)).  No results found.  . docusate sodium  100 mg Oral Daily  . polyethylene glycol  17 g Oral Daily  . prenatal multivitamin  1 tablet Oral Q1200  . senna-docusate  2 tablet Oral QHS  . sodium chloride  3 mL Intravenous Q12H   I have reviewed the patient's current medications.  ASSESSMENT: Patient Active Problem List   Diagnosis Date Noted  . Premature labor with rupture of membranes 03/05/2014  . PROM (premature rupture of membranes) 03/04/2014  . Placenta previa centralis 03/03/2014    PLAN:  Continue routine antenatal care. S/p Latency abx  NST twice daily  Perry MountACOSTA,Lucella Pommier ROCIO, MD  OB Fellow Faculty Practice, [facility]

## 2014-03-23 NOTE — Progress Notes (Signed)
Stopped by to order lunch for the patient. Eda H Royal Interpreter. °

## 2014-03-23 NOTE — Progress Notes (Signed)
Stopped by to check on patient's needs. Eda H Royal Interpreter. °

## 2014-03-24 NOTE — Progress Notes (Signed)
I stopped by to check on patient's need. By Orlan LeavensViria Alvarez Interpreter

## 2014-03-24 NOTE — Progress Notes (Signed)
Stopped by to check on patient's needs. Eda H Royal Interpreter. °

## 2014-03-24 NOTE — Progress Notes (Signed)
FACULTY PRACTICE ANTEPARTUM COMPREHENSIVE PROGRESS NOTE  Jillian Carlson is a 38 y.o. [id number] at 4799w1d  who is admitted for preterm premature rupture of membranes with complete previa.  Estimated Date of Delivery: 07/06/14 Fetal presentation is breech.  Length of Stay:  20 Days. 03/04/2014  Subjective:  Patient reports good fetal movement.  She reports denies uterine contractions, no bleeding, mild leakage of fluid. No abdominal pain, no fevers or chills.  Vitals:  Blood pressure 71/57, pulse 79, temperature 99.2 F (37.3 C), temperature source Oral, resp. rate 16, height 4' 11.75" (1.518 m), weight 74.299 kg (163 lb 12.8 oz), last menstrual period 09/29/2013, SpO2 100.00%.  Physical Examination: General appearance - alert, well appearing, and in no distress Mental status - alert, oriented to person, place, and time Abdomen - soft, nontender, nondistended, no masses or organomegaly Extremities - peripheral pulses normal, no pedal edema, no clubbing or cyanosis Skin - normal coloration and turgor, no rashes, no suspicious skin lesions noted Cervical Exam: Not evaluated.   Fetal Monitoring:  Baseline: 140bpm, Variability: Good {> 6 bpm), Accelerations: Reactive and Decelerations: Absent  No results found for this or any previous visit (from the past 24 hour(s)).  No results found.  . docusate sodium  100 mg Oral Daily  . polyethylene glycol  17 g Oral Daily  . prenatal multivitamin  1 tablet Oral Q1200  . senna-docusate  2 tablet Oral QHS  . sodium chloride  3 mL Intravenous Q12H   I have reviewed the patient's current medications.  ASSESSMENT: Patient Active Problem List   Diagnosis Date Noted  . Premature labor with rupture of membranes 03/05/2014  . PROM (premature rupture of membranes) 03/04/2014  . Placenta previa centralis 03/03/2014    PLAN:  Continue routine antenatal care. S/p Latency abx  NST twice daily  Jillian DessertWOHLERT, NOAH, MD  Family Medicine  Resident Faculty Practice, [facility] [place]  [place] fellow attestation:  I have seen and examined this patient; I agree with above documentation in the resident's note.   Jillian Carlson is a 38 y.o. [id number] admitted with premature preterm prolonged rupture of membranes  +FM, + LOF, some VB(not increased from previous episodes, no contractions  PE: BP 96/37  Pulse 77  Temp(Src) 98.6 F (37 C) (Oral)  Resp 16  Ht 4' 11.75" (1.518 m)  Wt 163 lb 12.8 oz (74.299 kg)  BMI 32.24 kg/m2  SpO2 100%  LMP 09/29/2013 Gen: calm comfortable, NAD Resp: normal effort, no distress Abd: gravid  ROS, labs, PMH reviewed NST reactive  Plan: - continue routine antenatal care - s/p BMZ and latency abx - NST BID - monitor for signs and symptoms of chorioamnionitis  [name], MD 12:39 PM

## 2014-03-24 NOTE — Progress Notes (Signed)
Stopped by to order lunch and check on patient's needs. Eda H Royal Interpreter. °

## 2014-03-25 LAB — TYPE AND SCREEN
ABO/RH(D): A POS
Antibody Screen: NEGATIVE

## 2014-03-25 NOTE — Plan of Care (Signed)
Problem: Phase I Progression Outcomes Goal: LOS < 4 days Outcome: Not Met (add Reason) Patient here for prolonged hospitalization due to PROM     

## 2014-03-25 NOTE — Progress Notes (Signed)
Patient ID: Jillian Carlson, female   DOB: 08/29/1975, 38 y.o.   MRN: [id number] ACULTY PRACTICE ANTEPARTUM COMPREHENSIVE PROGRESS NOTE  Jillian Carlson is a 38 y.o. [id number] at 3448w2d  who is admitted for PROM, placenta previa.   Fetal presentation is breech. Length of Stay:  21  Days  Subjective: Pt without complaints.  +FM, no ctx.  Still reports leakage of fluid which is light pink  Vitals:  Blood pressure 104/35, pulse 75, temperature 98.6 F (37 C), temperature source Oral, resp. rate 18, height 4' 11.75" (1.518 m), weight 163 lb 12.8 oz (74.299 kg), last menstrual period 09/29/2013, SpO2 98.00%. Physical Examination: General appearance - alert, well appearing, and in no distress Heart - normal rate, regular rhythm, normal S1, S2, no murmurs, rubs, clicks or gallops Abdomen - soft, nontender, nondistended, no masses or organomegaly Extremities - peripheral pulses normal, no pedal edema, no clubbing or cyanosis Cervical Exam: Not evaluated.  Membranes:ruptured  Fetal Monitoring:  Baseline: 140's bpm, Variability: Good {> 6 bpm), Accelerations: Non-reactive but appropriate for gestational age and Decelerations: Absent  Labs:  No results found for this or any previous visit (from the past 24 hour(s)).  Imaging Studies:    None since 9/26   Medications:  Scheduled . docusate sodium  100 mg Oral Daily  . polyethylene glycol  17 g Oral Daily  . prenatal multivitamin  1 tablet Oral Q1200  . senna-docusate  2 tablet Oral QHS  . sodium chloride  3 mL Intravenous Q12H   I have reviewed the patient's current medications.  ASSESSMENT: Patient Active Problem List   Diagnosis Date Noted  . Premature labor with rupture of membranes 03/05/2014  . PROM (premature rupture of membranes) 03/04/2014  . Placenta previa centralis 03/03/2014    PLAN: Deliver for fetal or maternal indications Continue routine antenatal care Keep current care.   HARRAWAY-SMITH,  Kytzia Gienger 03/25/2014,11:01 AM

## 2014-03-25 NOTE — Progress Notes (Signed)
Stopped by to check on patient's needs. Eda H Royal Interpreter. °

## 2014-03-25 NOTE — Progress Notes (Signed)
Stopped by to order patient's lunch and check on her needs. Eda H Royal Interpreter. °

## 2014-03-26 ENCOUNTER — Inpatient Hospital Stay (HOSPITAL_COMMUNITY): Payer: Medicaid Other

## 2014-03-26 NOTE — Progress Notes (Signed)
Patient ID: Jillian PassySusana Carlson, female   DOB: 06/14/1976, 38 y.o.   MRN: [id number] ACULTY PRACTICE ANTEPARTUM COMPREHENSIVE PROGRESS NOTE  [name] is a 38 y.o. [id number] at 412w3d  who is admitted for PROM.   Fetal presentation is breech. Length of Stay:  22  Days  Subjective: Pt denies complaints.  She reports +FM, No CTX, still reports some leakage of fluid.  She denies abd pain.  No f/c  Vitals:  Blood pressure 106/33, pulse 57, temperature 98.4 F (36.9 C), temperature source Oral, resp. rate 20, height 4' 11.75" (1.518 m), weight 163 lb 12.8 oz (74.299 kg), last menstrual period 09/29/2013, SpO2 100.00%. Physical Examination: General appearance - alert, well appearing, and in no distress Abdomen - soft, nontender, nondistended, no masses or organomegaly gravid Extremities - peripheral pulses normal, no pedal edema, no clubbing or cyanosis Cervical Exam: Not evaluated.  Membranes:ruptured  Fetal Monitoring:  Baseline: 150's bpm, Variability: Good {> 6 bpm), Accelerations: Reactive and Decelerations: Variable: occ mild  Labs:  Results for orders placed during the hospital encounter of 03/04/14 (from the past 24 hour(s))  TYPE AND SCREEN   Collection Time    03/25/14 11:54 AM      Result Value Ref Range   ABO/RH(D) A POS     Antibody Screen NEG     Sample Expiration 03/28/2014      Imaging Studies:    Last 9/26   Medications:  Scheduled . docusate sodium  100 mg Oral Daily  . polyethylene glycol  17 g Oral Daily  . prenatal multivitamin  1 tablet Oral Q1200  . senna-docusate  2 tablet Oral QHS  . sodium chloride  3 mL Intravenous Q12H   I have reviewed the patient's current medications.  ASSESSMENT: Patient Active Problem List   Diagnosis Date Noted  . Premature labor with rupture of membranes 03/05/2014  . PROM (premature rupture of membranes) 03/04/2014  . Placenta previa centralis 03/03/2014    PLAN: Sono today to check growth and fluid Deliver  for maternal or fetal indications  Continue routine antenatal care.   HARRAWAY-SMITH, Ezmae Speers 03/26/2014,7:33 AM

## 2014-03-26 NOTE — Progress Notes (Signed)
Stopped by to check on patient's needs and ordered her lunch. Eda H Royal  Interpreter. °

## 2014-03-26 NOTE — Progress Notes (Signed)
I assisted Dr. Erin FullingHarraway-Smith with questions. Eda H Royal Interpreter.

## 2014-03-26 NOTE — Progress Notes (Signed)
Assessment completed with Eda, interpreter at bedside.

## 2014-03-27 DIAGNOSIS — Z3A25 25 weeks gestation of pregnancy: Secondary | ICD-10-CM

## 2014-03-27 NOTE — Progress Notes (Signed)
Assisted RN with interpretation °Benita Sanchez - Spanish Interpreter °

## 2014-03-27 NOTE — Progress Notes (Signed)
Assisted RN with interpretation and ordering meals.  °Benita Sanchez -Spanish Interpreter °

## 2014-03-27 NOTE — Progress Notes (Signed)
Patient ID: Jillian Carlson, female   DOB: 09/01/1975, 38 y.o.   MRN: [id number]  FACULTY PRACTICE ANTEPARTUM NOTE  Jillian Carlson is a 38 y.o. [id number] at 2650w4d  who is admitted for PPROM with central previa.   Fetal presentation is breech. Length of Stay:  23  Days  Subjective:  Patient reports good fetal movement.  She reports no uterine contractions.  She continues to have small amount of rose colored leaking fluid when she is up to the bathroom.  Denies foul smelling discharge, vaginal bleeding, abdominal pain  Vitals:  Blood pressure 90/40, pulse 72, temperature 98.3 F (36.8 C), temperature source Oral, resp. rate 18, height 4' 11.75" (1.518 m), weight 74.299 kg (163 lb 12.8 oz), last menstrual period 09/29/2013, SpO2 100.00%. Physical Examination:  General appearance - alert, well appearing, and in no distress Abdomen - soft, nontender, nondistended, no masses or organomegaly Extremities - peripheral pulses normal, no pedal edema, no clubbing or cyanosis Fundal Height:  size equals dates Membrane: ruptured  Fetal Monitoring:  Baseline: 150 bpm, Variability: Good {> 6 bpm) and Accelerations: Reactive  Labs:  No results found for this or any previous visit (from the past 24 hour(s)).  Imaging Studies:    US done yesterday.  AFI 3.4.  Baby in breech position.  Medications:  Scheduled . docusate sodium  100 mg Oral Daily  . polyethylene glycol  17 g Oral Daily  . prenatal multivitamin  1 tablet Oral Q1200  . senna-docusate  2 tablet Oral QHS  . sodium chloride  3 mL Intravenous Q12H   I have reviewed the patient's current medications.  ASSESSMENT: Patient Active Problem List   Diagnosis Date Noted  . Premature labor with rupture of membranes 03/05/2014  . PROM (premature rupture of membranes) 03/04/2014  . Placenta previa centralis 03/03/2014    PLAN: Stable.   Deliver by cesarean section for maternal or fetal indications. Continue routine antenatal  care.   STINSON, JACOB JEHIEL 03/27/2014,9:30 AM

## 2014-03-28 LAB — CBC
HCT: 28.2 % — ABNORMAL LOW (ref 36.0–46.0)
Hemoglobin: 9.7 g/dL — ABNORMAL LOW (ref 12.0–15.0)
MCH: 31.3 pg (ref 26.0–34.0)
MCHC: 34.4 g/dL (ref 30.0–36.0)
MCV: 91 fL (ref 78.0–100.0)
Platelets: 199 10*3/uL (ref 150–400)
RBC: 3.1 MIL/uL — AB (ref 3.87–5.11)
RDW: 14.6 % (ref 11.5–15.5)
WBC: 7.1 10*3/uL (ref 4.0–10.5)

## 2014-03-28 MED ORDER — LACTATED RINGERS IV SOLN
INTRAVENOUS | Status: DC
  Administered 2014-03-28 – 2014-03-29 (×3): via INTRAVENOUS

## 2014-03-28 NOTE — Progress Notes (Signed)
Assisted RN with interpretation °Benita Sanchez - Spanish Interpreter °

## 2014-03-28 NOTE — Progress Notes (Signed)
chux changed. Moderate amount of bright red bleeding noted. Physician is at the bedside. 2nd I.V. Site being started.

## 2014-03-28 NOTE — Progress Notes (Signed)
Patient ID: Jillian Carlson, female   DOB: 01/19/1976, 38 y.o.   MRN: [id number]  FACULTY PRACTICE ANTEPARTUM NOTE  Jillian Carlson is a 38 y.o. [id number] at 1816w4d  who is admitted for PPROM with central previa.   Fetal presentation is breech. Length of Stay:  24  Days  Subjective:  Patient reports good fetal movement.  She reports no uterine contractions.  She continues to have small amount of rose colored leaking fluid when she is up to the bathroom - she reports using 2 pads yesterday.  Denies foul smelling discharge, vaginal bleeding, abdominal pain  Vitals:  Blood pressure 83/42, pulse 75, temperature 98.5 F (36.9 C), temperature source Oral, resp. rate 20, height 4' 11.75" (1.518 m), weight 74.299 kg (163 lb 12.8 oz), last menstrual period 09/29/2013, SpO2 100.00%. Physical Examination:  General appearance - alert, well appearing, and in no distress Abdomen - soft, nontender, nondistended, no masses or organomegaly Extremities - peripheral pulses normal, no pedal edema, no clubbing or cyanosis Fundal Height:  size equals dates Membrane: ruptured  Fetal Monitoring:  Baseline: 150 bpm, Variability: Good {> 6 bpm) and Accelerations: Reactive  Labs:  No results found for this or any previous visit (from the past 24 hour(s)).  Imaging Studies:    US done yesterday.  AFI 3.4.  Baby in breech position.  Medications:  Scheduled . docusate sodium  100 mg Oral Daily  . polyethylene glycol  17 g Oral Daily  . prenatal multivitamin  1 tablet Oral Q1200  . senna-docusate  2 tablet Oral QHS  . sodium chloride  3 mL Intravenous Q12H   I have reviewed the patient's current medications.  ASSESSMENT: Patient Active Problem List   Diagnosis Date Noted  . Premature labor with rupture of membranes 03/05/2014  . PROM (premature rupture of membranes) 03/04/2014  . Placenta previa centralis 03/03/2014    PLAN: Stable.  No frank bleeding. Fetal heart tracing reassuring  Deliver  by cesarean section for maternal or fetal indications. Continue routine antenatal care.   STINSON, JACOB JEHIEL 03/28/2014,7:51 AM

## 2014-03-28 NOTE — Progress Notes (Signed)
Assisted RN with interpretation and food order Jillian Carlson - Spanish Interpreter

## 2014-03-28 NOTE — Progress Notes (Signed)
Assisted RN with interpretation Joselyn GlassmanBenita Sanchez - Spanish Interpreter

## 2014-03-28 NOTE — Progress Notes (Signed)
Assisted Dr with interpretation update on patient status Jillian Carlson - Spanish Interpreter

## 2014-03-28 NOTE — Progress Notes (Signed)
Called to patient's room. Patient states 'blood'. Moderate to large amount of bleeding noted on perineum and pad. Pad/panties/ and chux changed.

## 2014-03-29 MED ORDER — SODIUM CHLORIDE 0.9 % IV SOLN
1020.0000 mg | Freq: Once | INTRAVENOUS | Status: AC
  Administered 2014-03-29: 1020 mg via INTRAVENOUS
  Filled 2014-03-29: qty 34

## 2014-03-29 NOTE — Progress Notes (Signed)
Patient ID: Jillian Carlson, female   DOB: 10/19/1975, 38 y.o.   MRN: [id number] FACULTY PRACTICE ANTEPARTUM NOTE  [name] is a 38 y.o. [id number] at 6884w6d  who is admitted for PPROM with central previa.  Fetal presentation is breech.  Length of Stay: 25  Days  Subjective:  Patient reports good fetal movement. She reports no uterine contractions.  She had a heavy bleeding episode yesterday at about 2 pm. Essentially soaked a pad.  The bleeding persisted but diminished over the nextcouple of hours and since then has been sort of a steady spotting sometime trickle.  There was also an episode of FHR in the 60s which resolved in about 3 minutes  Vitals: Blood pressure 83/42, pulse 75, temperature 98.5 F (36.9 C), temperature source Oral, resp. rate 20, height 4' 11.75" (1.518 m), weight 74.299 kg (163 lb 12.8 oz), last menstrual period 09/29/2013, SpO2 100.00%.  Physical Examination:  General appearance - alert, well appearing, and in no distress  Abdomen - soft, nontender, nondistended, no masses or organomegaly  Extremities - peripheral pulses normal, no pedal edema, no clubbing or cyanosis  Fundal Height: size equals dates  Membrane: ruptured  Fetal Monitoring: Baseline: 150 bpm, Variability: Good {> 6 bpm) and Accelerations: Reactive  Labs:  No results found for this or any previous visit (from the past 24 hour(s)).  Imaging Studies:  US done 10/12 AFI 3.4. Baby in breech position.  Medications: Scheduled  .  docusate sodium  100 mg  Oral  Daily   .  polyethylene glycol  17 g  Oral  Daily   .  prenatal multivitamin  1 tablet  Oral  Q1200   .  senna-docusate  2 tablet  Oral  QHS   .  sodium chloride  3 mL  Intravenous  Q12H   I have reviewed the patient's current medications.  ASSESSMENT:  7884w6d Estimated Date of Delivery: 07/06/14  Patient Active Problem List    Diagnosis  Date Noted   .  Premature labor with rupture of membranes  03/05/2014   .  PROM (premature rupture  of membranes)  03/04/2014   .  Placenta previa centralis  03/03/2014   PLAN:  Bleeding episode yesterday stable now Fetal heart tracing reassuring  Deliver by cesarean section for maternal or fetal indications.  Continue routine antenatal care.   Jadie Comas H 03/29/2014 8:03 AM

## 2014-03-29 NOTE — Discharge Summary (Signed)
Obstetric Discharge Summary Reason for Admission: premature rupture of membranes Prenatal Procedures: NST and ultrasound Intrapartum Procedures: none Postpartum Procedures: n/a Complications-Operative and Postpartum: n/a    Hospital Course:  Active Problems:   PROM (premature rupture of membranes)   Premature labor with rupture of membranes prolonged premature preterm rupture of membranes Central previa Vaginal bleeding  Today: No acute events overnight.  She denies nausea or vomiting.  Currently denies pain, is spotting today noting that pad changed q3-4 hours has trickle of blood.  Discussed possible transfer today secondary to no beds available for baby in NICU and patient is agreeable as she would like to be able to visit the baby postpartum following delivery.  Also discussed possible risk of decompensation during transfer, however she is currently stable.  HPI: [id number] at 2067w2d by LMP c/s 5238w1d sono who presented to maternity admissions reported 2 gushes of brown-bloody fluid at noon 03/04/14 and found to be ruptured with positive fern and complete previa on ultrasound.  03/28/14 patient was noted to have increased bleeding with a deceleration down to 60s, bleeding resolved and fetal heart tones returned to normal without intervention.  S/p: - betamethasone 12mg : 9/21, 9/22 (at 4055w6d, 313w0d) - latency antibiotics starting 9/17 for total 7 days of therapy:   ampicillin 2g q6h x8 doses followed by amoxicillin 500mg  q8h ending 9/24  Azithromycin 500mg  IV q24h x 2d followed by azithromycin 500mg  PO x5 days ending 9/23 - feraheme 510mg  9/26, 1020mg  10/12 - mag: did not receive upon admission 2/2 previable, has not received cerebral palsy prophylaxis secondary suspicion that patient would remain pregnant for at least 48 hours  H/H: Lab Results  Component Value Date/Time   HGB 9.7* 03/28/2014  2:59 PM   HGB 11.6 01/21/2014   HCT 28.2* 03/28/2014  2:59 PM   HCT 35 01/21/2014    ABO,  Rh: --/--/A POS (10/11 1155) Antibody: NEG (10/11 1155) Rubella:   Immune RPR: Nonreactive, Nonreactive (08/06 0000)  HBsAg: Negative (08/06 0000)  HIV: Non-reactive (08/06 0000)  GBS:     1 hr Glucola 115 Genetic screening  Quad screen negative Anatomy US normal    Discharge Diagnoses: prolonged premature preterm rupture of membranes, central previa  Discharge Information: Date: 03/29/2014 Activity: pelvic rest Diet: routine  Medications: None Breast feeding:  Yes Condition: stable Instructions: n/a Accepting physician is Dr. Wilhemena DurieWhitacker    Medication List    ASK your doctor about these medications       indomethacin 25 MG capsule  Commonly known as:  INDOCIN  Take 1 capsule (25 mg total) by mouth every 6 (six) hours.     multivitamin-prenatal 27-0.8 MG Tabs tablet  Take 1 tablet by mouth daily at 12 noon.         Perry MountACOSTA,Caisen Mangas ROCIO ,MD OB Fellow 03/29/2014,9:26 AM

## 2014-03-29 NOTE — Plan of Care (Signed)
Problem: Phase I Progression Outcomes Goal: No significant worsening bleeding/cervix change/vag drainage No significant worsening in vaginal bleeding, cervical change, or vaginal drainage.  Outcome: Progressing Pt. Had bleed yesterday 03/26/14 for small to moderate amt. Goal: Contractions < 5-6/hour Outcome: Progressing Pt. Contractions have increased to 2-6/hr. At times.

## 2014-03-29 NOTE — Progress Notes (Signed)
Jillian Carlson reported that she was anxious for her baby.  She was okay with the move because she wants to make sure the baby will be able to have the carry needed.  I stayed with her while [facility] came into the room to offer additional support.  The family requested prayer and we shared prayer with Jillian Carlson's husband who is a Education officer, environmentalpastor.    [address] King DriveChaplain Katy Decaturlaussen Pager, [phone] 11:16 AM   03/29/14 1100  Clinical Encounter Type  Visited With Patient  Visit Type Spiritual support  Referral From Nurse  Spiritual Encounters  Spiritual Needs Emotional

## 2014-03-29 NOTE — Discharge Summary (Signed)
Attestation of Attending Supervision of Advanced Practitioner (PA/CNM/NP): Evaluation and management procedures were performed by the Advanced Practitioner under my supervision and collaboration.  I have reviewed the Advanced Practitioner's note and chart, and I agree with the management and plan.  Reva BoresPRATT,Collins Kerby S, MD [facility] Attending 03/29/2014 11:03 AM

## 2014-03-30 LAB — TYPE AND SCREEN
ABO/RH(D): A POS
ANTIBODY SCREEN: NEGATIVE
UNIT DIVISION: 0
Unit division: 0

## 2014-04-12 ENCOUNTER — Ambulatory Visit (HOSPITAL_COMMUNITY): Payer: Medicaid Other | Attending: Nurse Practitioner

## 2014-04-12 ENCOUNTER — Ambulatory Visit (HOSPITAL_COMMUNITY): Admission: RE | Admit: 2014-04-12 | Payer: Medicaid Other | Source: Ambulatory Visit

## 2014-05-03 ENCOUNTER — Ambulatory Visit: Payer: Medicaid Other | Admitting: Family Medicine

## 2014-05-31 ENCOUNTER — Encounter: Payer: Self-pay | Admitting: *Deleted

## 2014-06-04 ENCOUNTER — Encounter: Payer: Self-pay | Admitting: *Deleted

## 2014-06-07 ENCOUNTER — Encounter (HOSPITAL_COMMUNITY): Payer: Self-pay | Admitting: *Deleted

## 2014-07-07 ENCOUNTER — Ambulatory Visit (INDEPENDENT_AMBULATORY_CARE_PROVIDER_SITE_OTHER): Payer: Self-pay | Admitting: Obstetrics & Gynecology

## 2014-07-07 ENCOUNTER — Encounter: Payer: Self-pay | Admitting: Obstetrics & Gynecology

## 2014-07-07 NOTE — Patient Instructions (Signed)
Informacin sobre Engineer, civil (consulting)la esterilizacin en las mujeres  (Sterilization Information, Female) La esterilizacin en la mujer es un procedimiento que se realiza para Location managerevitar el embarazo de Stap[place]manera permanente. Hay diferentes formas de Futures traderrealizar la esterilizacin, Biomedical engineerpero en todos los casos se bloquean o se cierran las trompas de [place] para que vulos no puedan llegar al tero. Si el vulo no llega al tero, los espermatozoides no fertilizan el vulo, y usted no podr Burundiquedar embarazada.  La esterilizacin se lleva a cabo por medio de un procedimiento quirrgico. A veces, estos procedimientos se realizan en el hospital haciendo dormir a Education officer, communityla paciente. En otros casos se realiza en un consultorio en una clnica y la paciente permanece despierta. Las trompas de [place]opio se pueden cortar, Public affairs consultantligar o sellar quirrgicamente, con un procedimiento que se llama ligadura de trompas. Otro mtodo es cerrarlas con clips o anillos. La esterilizacin tambin puede realizarse mediante la colocacin de un pequeo resorte en cada trompa de [place], lo que hace que se desarrolle tejido cicatrizal en el interior de la trompa. Luego el tejido Verizoncicatrizal obstruye las trompas.   Comente el tema con su mdico para responder las inquietudes que usted o su pareja puedan Warehouse managertener. Podr preguntarle a su mdico qu tipo de Diplomatic Services operational officeresterilizacin se realizar. Algunos profesionales pueden no realizar todas las opciones. La esterilizacin es permanente y slo debe hacerse si est segura de que no desea tener hijos o no desea tener ms hijos. La reversin de la esterilizacin puede no tener xito.  PROCEDIMIENTOS PARA LA ESTERILIZACIN   Esterilizacin laparoscpica. Es un mtodo quirrgico que se Biomedical engineerrealiza en otro momento que no es inmediatamente despus del [place] Harborparto. Se realizan dos incisiones en la zona baja del abdomen. Un tubo delgado con una fuente de luz (laparoscopio) se inserta en una de las incisiones y se [place] ([place]) Islandsutiliza para Surveyor, quantityrealizar el procedimiento. Las trompas de  [place] se cierran con un anillo o un clip. Podrn utilizar un instrumento que utiliza el calor para sellar las trompas (electrocauterizacin).  Mini-laparotoma. Es un mtodo quirrgico que se Biomedical engineerrealiza 1 o 2 das despus del [place]arto. En general, consiste en una pequea incisin que se hace justo debajo del (ombligo) por el cual se visualizan las trompas de [place]. Las trompas pueden ser selladas, atadas o cortadas.   Esterilizacin histeroscpica. Esto se realiza en otro momento que no sea inmediatamente despus del parto. Se coloca un pequeo resorte helicoidal a travs del cuello y del cuerpo del tero y se inserta en las trompas de [place]alopio. El resorte produce cicatrizacin y Thrivent Financialobstruye las trompas. Se deben utilizar otras formas de anticoncepcin durante 3 meses despus del procedimiento para permitir que el tejido cicatrizal se forme completamente. Adems, es necesario hacer una histerosalpingografa despus de 3 meses para asegurarse de que el procedimiento ha sido exitoso.  La histerosalpingografa es un procedimiento en el que utilizan rayos X para observar el tero y las trompas de [place] despus de insertar un dispositivo, para asegurarse de que est bien colocado. LA ESTERILIZACIN ES UN PROCEDIMIENTO SEGURO?  La esterilizacin se considera un procedimiento seguro en el que rara vez se producen complicaciones. Los riesgos dependen del tipo de procedimiento que se realicen. Al igual que con cualquier procedimiento quirrgico, puede haber riesgos. Algunos son:   Heron NaySangrado.  Infeccin.  Reaccin a la anestesia.  Lesin en los rganos circundantes. Los riesgos especficos de la colocacin de los resortes por histeroscopa son:   No se Production designer, theatre/television/filmpueden colocar correctamente la primera vez.   Las resortes pueden salirse del Environmental consultantlugar.  Las trompas no quedan completamente bloqueadas despus de 3 meses.   Ocurre una lesin en los rganos adyacentes al colocarlo.  LA ESTERILIZACIN ES UN MTODO  EFECTIVO?  La esterilizacin es efectiva en casi 100% , pero puede fallar. Segn el tipo de esterilizacin, el porcentaje de fracaso puede ser tan alto como en un 3%. Despus de la esterilizacin histeroscpica con colocacin de un resorte en las trompas de [place], necesitar un mtodo anticonceptivo de respaldo durante 3 meses. La esterilizacin es efectiva durante toda la vida.  LOS BENEFICIOS DE LA ESTERILIZACIN   No afecta las hormonas, y por lo tanto no afectar sus perodos menstruales, el deseo o el rendimiento sexual, .   Es efectiva para toda la vida.   Es segura.   No tiene que preocuparse por evitar el embarazo. Tenga en cuenta que si fue sometida a este procedimiento, debe esperar 3 meses (o hasta que el mdico lo confirme) antes de considerar que no quedar embarazada.   No hay efectos secundarios a diferencia de otros tipos de control de la natalidad (contracepcin).  INCONVENIENTES DE LA ESTERILIZACIN   Usted debe estar seguro de que no desea tener hijos o ms hijos. El procedimiento es permanente.   No ofrece proteccin contra las infecciones de transmisin sexual (ITS).   Las trompas pueden volver a desarrollarse. Si esto ocurre, habr riesgo de embarazo. Tambin hay un mayor riesgo (50%) que el embarazo sea ectpico. Se llama as al embarazo que ocurre fuera del tero. Document Released: 11/21/2007 Document Revised: 06/09/2013 ExitCare Patient Information 2015 [employer]. This information is not intended to replace advice given to you by your health care provider. Make sure you discuss any questions you have with your health care provider.  

## 2014-07-07 NOTE — Progress Notes (Signed)
Patient ID: Jillian PassySusana Carlson, female   DOB: 03/21/1976, 39 y.o.   MRN: [id number] Subjective:     Jillian SoleSusana Carlson is a 39 y.o. female who presents for a postpartum visit. She is 8 weeks postpartum following a low cervical transverse Cesarean section. I have fully reviewed the prenatal and intrapartum course. The delivery was at ~34 gestational weeks. Outcome: primary cesarean section, low transverse incision.  Postpartum course has been uncomplicated. Baby's course has been unremarkable.  Infant stayed in hospital 1 week after mom was discharged. Baby is feeding by breast and bottle. Bleeding no bleeding. Bowel function is normal. Bladder function is normal. Patient is not sexually active. Contraception method is tubal ligation. Postpartum depression screening: negative (asked verbally). Pt had PROM in the face of a placenta previa.  She was hospitalized for many weeks and [facility]  The following portions of the patient's history were reviewed and updated as appropriate: allergies, current medications, past family history, past medical history, past social history, past surgical history and problem list.  Review of Systems A comprehensive review of systems was negative.   Objective:    BP 111/56 mmHg  Pulse 61  Temp(Src) 98.1 F (36.7 C) (Oral)  Wt 163 lb 1.6 oz (73.982 kg)  Breastfeeding? Yes  General:  alert and no distress           Abdomen: soft, non-tender; bowel sounds normal; no masses,  no organomegaly and incision c/d/i                          Assessment:     8 week postpartum exam. Pap smear not done at today's visit.   Plan:    1. Contraception: tubal ligation 2.  Follow up prn.

## 2014-10-05 IMAGING — US US OB FOLLOW-UP
1 series · 12 of 28 positions shown · non-contrast
Comparison: none

[Series 1: us ob follow up · 74 acquisitions, 12 frames shown]
[im 3/74]
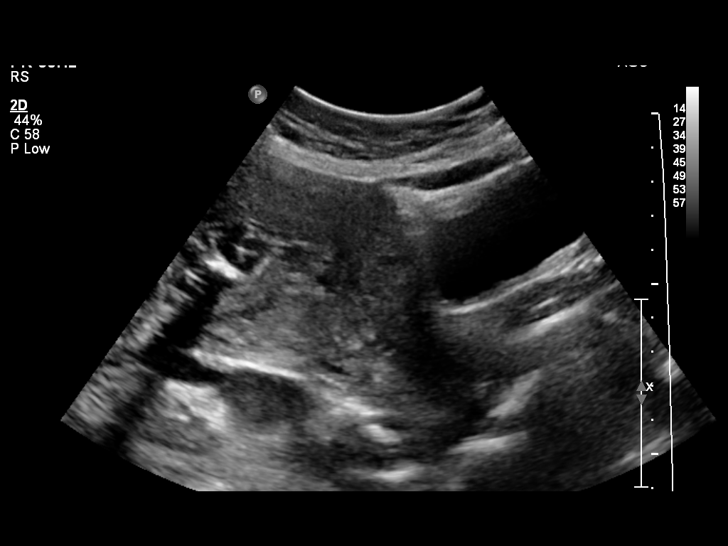
[im 9/74]
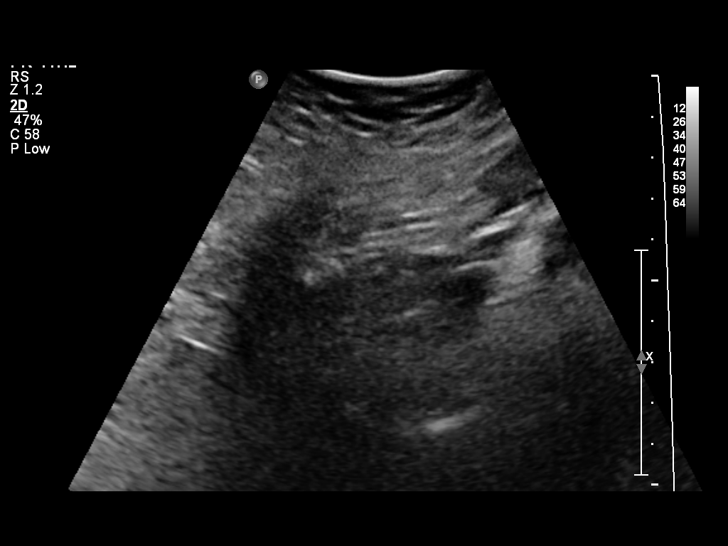
[im 14/74]
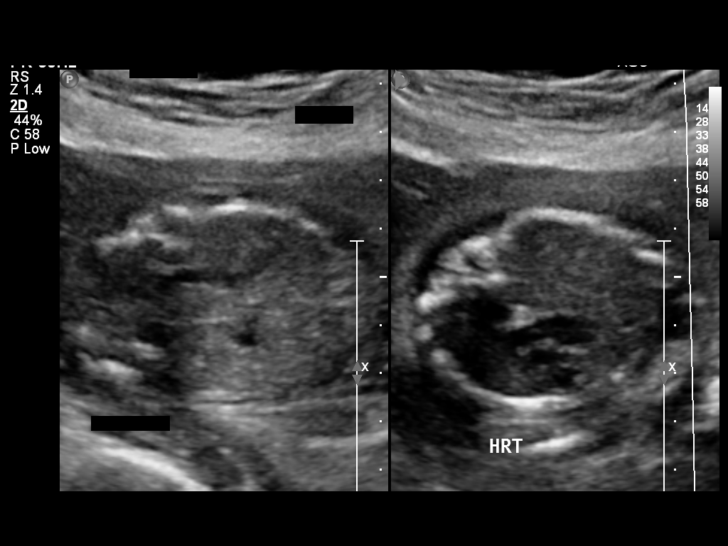
[im 22/74]
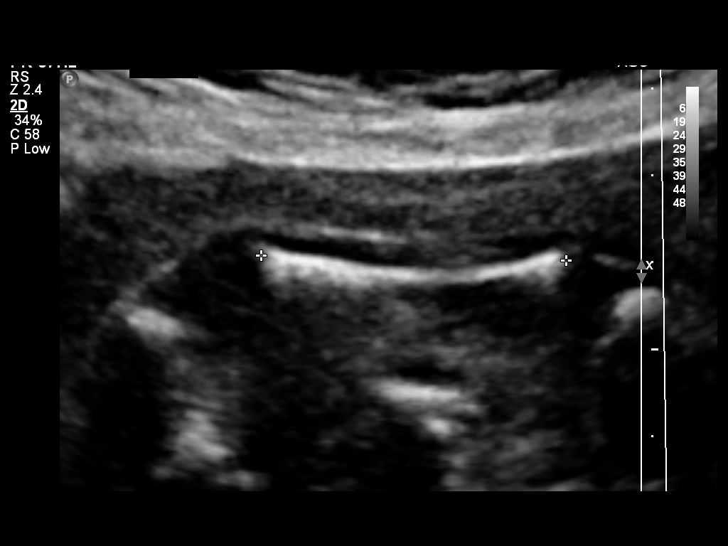
[im 28/74]
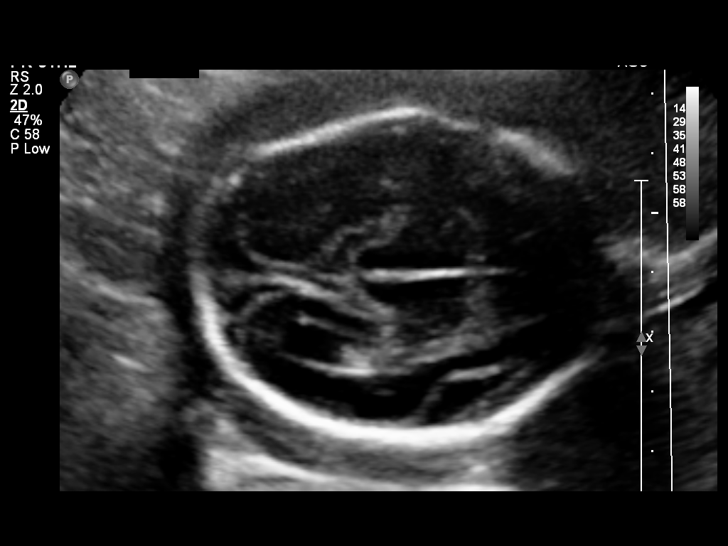
[im 33/74]
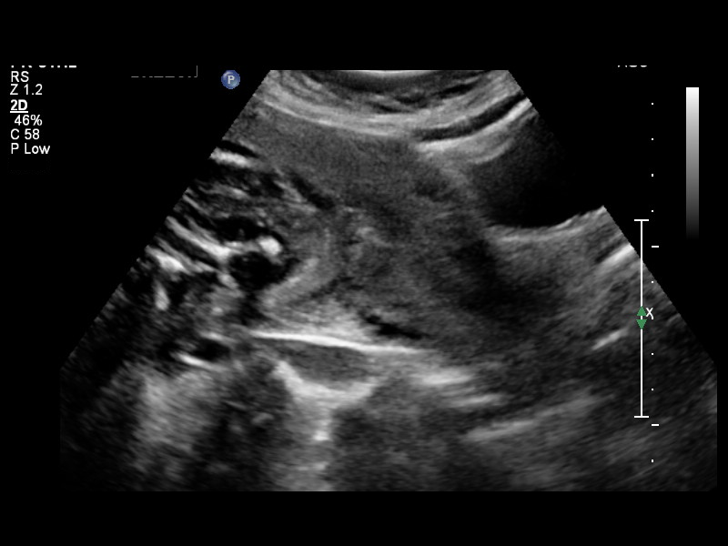
[im 41/74]
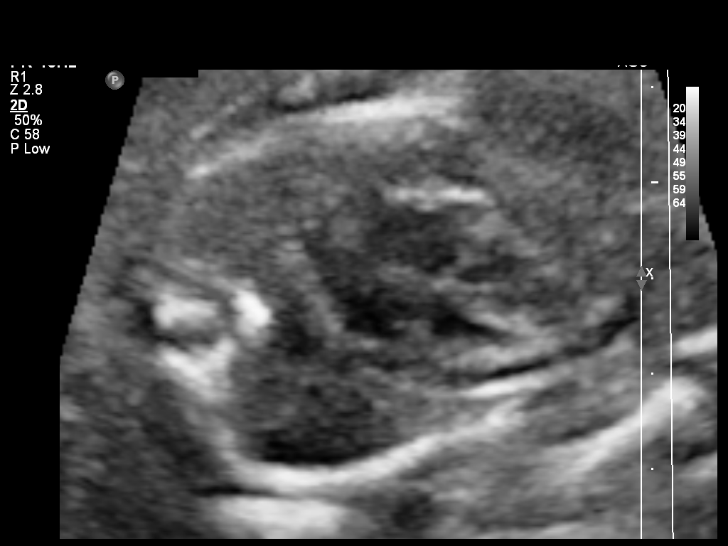
[im 46/74]
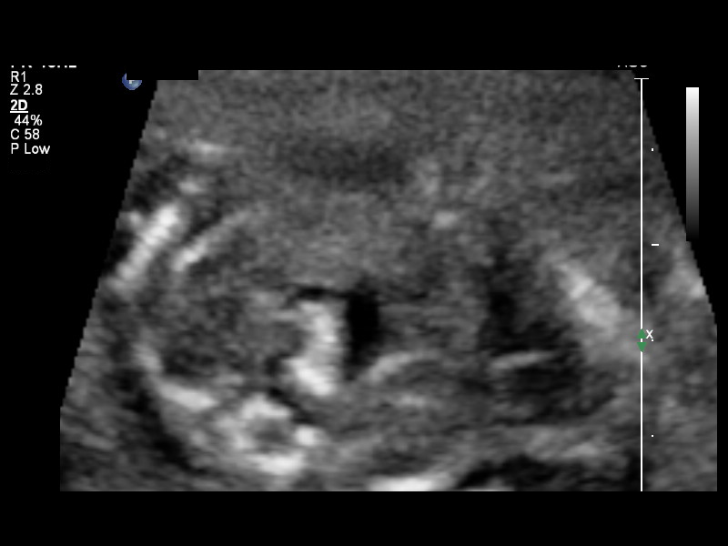
[im 52/74]
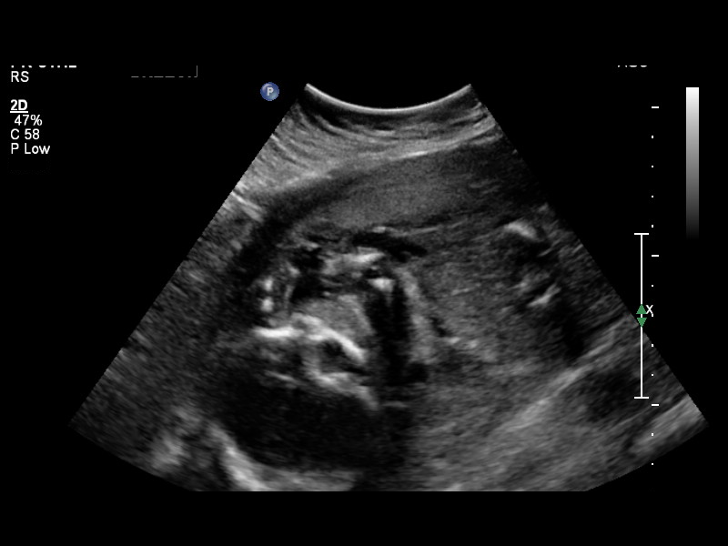
[im 60/74]
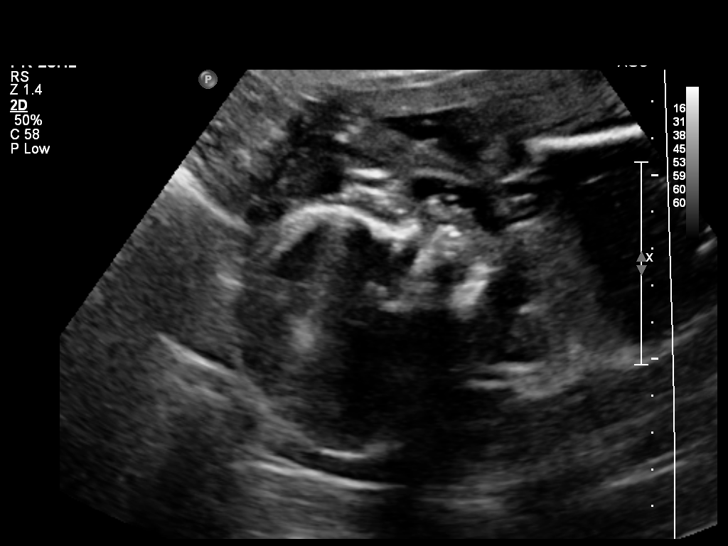
[im 65/74]
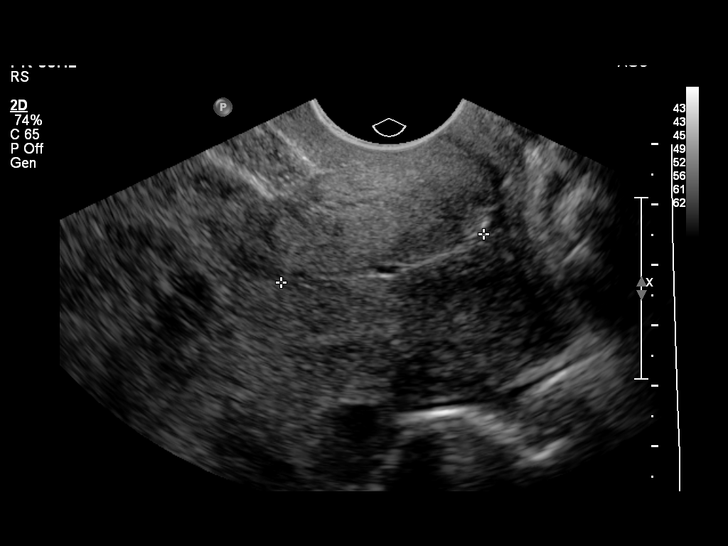
[im 71/74]
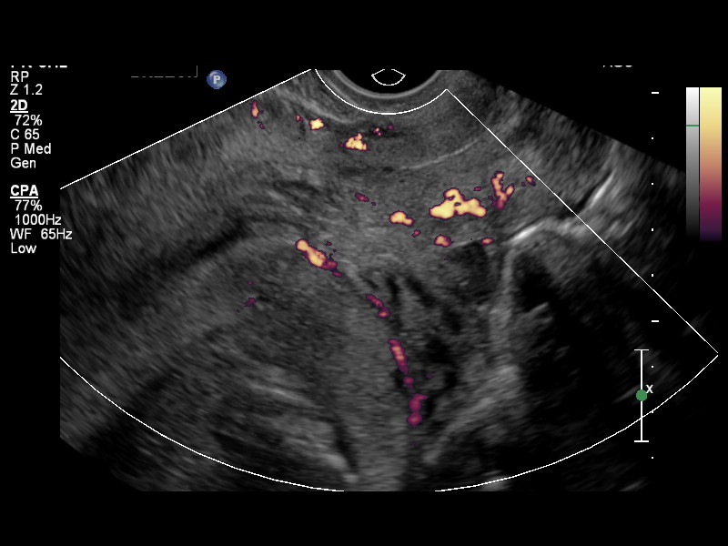

[12 of 28 positions shown; findings below may reference images not displayed]

OBSTETRICS REPORT
                    (Corrected Final 03/26/2014 [DATE])

Service(s) Provided

 US OB FOLLOW UP                                       76816.1
 US MFM OB TRANSVAGINAL                                76817.2
Indications

 Pain - Abdominal/Pelvic
 Placenta previa/Low lying: Bleeding
 Advanced maternal age (AMA), Multigravida (38)
 Poor obstetric history: Previous preterm delivery
Fetal Evaluation

 Num Of Fetuses:    1
 Fetal Heart Rate:  142                          bpm
 Cardiac Activity:  Observed
 Presentation:      Breech
 Placenta:          Central previa

 Amniotic Fluid
 AFI FV:      Oligohydramnios
Biometry

 BPD:     54.6  mm     G. Age:  22w 4d                CI:        73.09   70 - 86
                                                      FL/HC:      19.1   18.4 -

 HC:       203  mm     G. Age:  22w 3d       44  %    HC/AC:      1.10   1.06 -

 AC:       185  mm     G. Age:  23w 2d       74  %    FL/BPD:     70.9   71 - 87
 FL:      38.7  mm     G. Age:  22w 3d       44  %    FL/AC:      20.9   20 - 24
 HUM:     35.8  mm     G. Age:  22w 3d       49  %

 Est. FW:     540  gm      1 lb 3 oz     57  %
Gestational Age

 LMP:           22w 2d        Date:  09/29/13                 EDD:   07/06/14
 U/S Today:     22w 5d                                        EDD:   07/03/14
 Best:          22w 2d     Det. By:  LMP  (09/29/13)          EDD:   07/06/14
Anatomy

 Cranium:          Appears normal         Aortic Arch:      Appears normal
 Fetal Cavum:      Previously seen        Ductal Arch:      Not well visualized
 Ventricles:       Appears normal         Diaphragm:        Appears normal
 Choroid Plexus:   Previously seen        Stomach:          Appears normal, left
                                                            sided
 Cerebellum:       Previously seen        Abdomen:          Not well visualized
 Posterior Fossa:  Previously seen        Abdominal Wall:   Not well visualized
 Nuchal Fold:      Not applicable (>20    Cord Vessels:     Previously seen
                   wks GA)
 Face:             Orbits previously      Kidneys:          Appear normal
                   seen
 Lips:             Not well visualized    Bladder:          Appears normal
 Heart:            Not well visualized    Spine:            Previously seen
 RVOT:             Previously seen        Lower             Previously seen
                                          Extremities:
 LVOT:             Not well visualized    Upper             Previously seen
                                          Extremities:

 Other:  Fetus appears to be a male. Heels previously visualized. Technically
         difficult due to low amniotic fluid.
Targeted Anatomy

 Fetal Central Nervous System
 Lat. Ventricles:
Cervix Uterus Adnexa

 Cervical Length:    3.45     cm

 Cervix:       Normal appearance by transvaginal scan
 Uterus:       No abnormality visualized.
 Left Ovary:    Not visualized.
 Right Ovary:   Not visualized.
 Adnexa:     No abnormality visualized. No adnexal mass visualized.
Comments

 Ms. Philipandantoinette has a known complete, central placenta
 previa and now has premature rupture of membranes. While
 D&E is theorectically possible up to a BPD of 58 cm, without
 an open cervix or the ability to use laminaria - in the event
 that the patient experiences hemorrhage or requires
 emergent delivery prior to viability, the safest option may
 indeed be hysterotomy.  Should delivery need to be
 accomplished in a more controlled setting (i.e. IUFD,
 intraamniotic infection without bleeding) - it may be
 reasonable to discuss this option again with providers at [HOSPITAL].
Impression

 Single IUP at 22w 2d
 Limited views of the fetal anatomy obtained due to
 oligohydramnios
 Estimated fetal weight 540g (57%tile).  The BPD is 54 cm.
 Complete, central placenta previa
 Breech presentation
Recommendations

 See comments.
                 Attending Physician, SHANDRA

## 2019-09-04 ENCOUNTER — Ambulatory Visit: Payer: Self-pay | Attending: Nurse Practitioner | Admitting: Nurse Practitioner

## 2019-09-04 ENCOUNTER — Encounter: Payer: Self-pay | Admitting: Nurse Practitioner

## 2019-09-04 DIAGNOSIS — Z131 Encounter for screening for diabetes mellitus: Secondary | ICD-10-CM

## 2019-09-04 DIAGNOSIS — Z7689 Persons encountering health services in other specified circumstances: Secondary | ICD-10-CM

## 2019-09-04 DIAGNOSIS — Z13220 Encounter for screening for lipoid disorders: Secondary | ICD-10-CM

## 2019-09-04 DIAGNOSIS — Z1329 Encounter for screening for other suspected endocrine disorder: Secondary | ICD-10-CM

## 2019-09-04 DIAGNOSIS — Z13228 Encounter for screening for other metabolic disorders: Secondary | ICD-10-CM

## 2019-09-04 DIAGNOSIS — Z130 Encounter for screening for diseases of the blood and blood-forming organs and certain disorders involving the immune mechanism: Secondary | ICD-10-CM

## 2019-09-04 NOTE — Progress Notes (Signed)
Virtual Visit via Telephone Note Due to national recommendations of social distancing due to [place] 19, telehealth visit is felt to be most appropriate for this patient at this time.  I discussed the limitations, risks, security and privacy concerns of performing an evaluation and management service by telephone and the availability of in person appointments. I also discussed with the patient that there may be a patient responsible charge related to this service. The patient expressed understanding and agreed to proceed.    I connected with Jillian Carlson on 09/04/19  at   3:30 PM EDT  EDT by telephone and verified that I am speaking with the correct person using two identifiers.   Consent I discussed the limitations, risks, security and privacy concerns of performing an evaluation and management service by telephone and the availability of in person appointments. I also discussed with the patient that there may be a patient responsible charge related to this service. The patient expressed understanding and agreed to proceed.   Location of Patient: Private Residence   Location of Provider: [place] [facility] [employer] Office    Persons participating in Telemedicine visit: [name] FNP-BC [place] Interpreter [place] [id number]   History of Present Illness: Telemedicine visit for: Establish Care  Referred to the breast clinic today for mammogram.  Will be scheduled for labs as well today.       Past Medical History:  Diagnosis Date  . Vaginal Pap smear, abnormal     Past Surgical History:  Procedure Laterality Date  . CESAREAN SECTION    . DILATION AND EVACUATION N/A 08/18/2013   Procedure: DILATATION AND EVACUATION;  Surgeon: Mora Bellman, MD;  Location: [facility];  Service: Gynecology;  Laterality: N/A;  . TUBAL LIGATION      Family History  Problem Relation Age of Onset  . Diabetes Neg Hx     Social History    Socioeconomic History  . Marital status: Married    Spouse name: Not on file  . Number of children: Not on file  . Years of education: Not on file  . Highest education level: Not on file  Occupational History  . Not on file  Tobacco Use  . Smoking status: Never Smoker  . Smokeless tobacco: Never Used  Substance and Sexual Activity  . Alcohol use: No  . Drug use: No  . Sexual activity: Yes    Birth control/protection: Surgical  Other Topics Concern  . Not on file  Social History Narrative  . Not on file   Social Determinants of Health   Financial Resource Strain:   . Difficulty of Paying Living Expenses:   Food Insecurity:   . Worried About Charity fundraiser in the Last Year:   . Arboriculturist in the Last Year:   Transportation Needs:   . Film/video editor (Medical):   Marland Kitchen Lack of Transportation (Non-Medical):   Physical Activity:   . Days of Exercise per Week:   . Minutes of Exercise per Session:   Stress:   . Feeling of Stress :   Social Connections:   . Frequency of Communication with Friends and Family:   . Frequency of Social Gatherings with Friends and Family:   . Attends Religious Services:   . Active Member of Clubs or Organizations:   . Attends Archivist Meetings:   Marland Kitchen Marital Status:      Observations/Objective: Awake, alert and oriented x 3  ROS  Assessment and Plan: Jillian Carlson was seen today for new patient (initial visit).  Diagnoses and all orders for this visit:  Encounter to establish care  Lipid screening -     Lipid panel; Future  Screening for metabolic disorder -     KGY17+LWHK; Future  Screening for deficiency anemia -     CBC; Future  Encounter for screening for diabetes mellitus -     Hemoglobin A1c; Future  Thyroid disorder screening -     TSH; Future     Follow Up Instructions Return for PAP SMEAR.     I discussed the assessment and treatment plan with the patient. The patient was provided an  opportunity to ask questions and all were answered. The patient agreed with the plan and demonstrated an understanding of the instructions.   The patient was advised to call back or seek an in-person evaluation if the symptoms worsen or if the condition fails to improve as anticipated.  I provided 14 minutes of non-face-to-face time during this encounter including median intraservice time, reviewing previous notes, labs, imaging, medications and explaining diagnosis and management.  Gildardo Pounds, FNP-BC

## 2019-09-08 ENCOUNTER — Ambulatory Visit: Payer: Self-pay | Attending: Nurse Practitioner

## 2019-09-08 ENCOUNTER — Other Ambulatory Visit: Payer: Self-pay

## 2019-09-08 DIAGNOSIS — Z13228 Encounter for screening for other metabolic disorders: Secondary | ICD-10-CM

## 2019-09-08 DIAGNOSIS — Z130 Encounter for screening for diseases of the blood and blood-forming organs and certain disorders involving the immune mechanism: Secondary | ICD-10-CM

## 2019-09-08 DIAGNOSIS — Z131 Encounter for screening for diabetes mellitus: Secondary | ICD-10-CM

## 2019-09-08 DIAGNOSIS — Z1329 Encounter for screening for other suspected endocrine disorder: Secondary | ICD-10-CM

## 2019-09-08 DIAGNOSIS — Z13220 Encounter for screening for lipoid disorders: Secondary | ICD-10-CM

## 2019-09-09 LAB — CMP14+EGFR
ALT: 19 IU/L (ref 0–32)
AST: 19 IU/L (ref 0–40)
Albumin/Globulin Ratio: 1.7 (ref 1.2–2.2)
Albumin: 4.5 g/dL (ref 3.8–4.8)
Alkaline Phosphatase: 105 IU/L (ref 39–117)
BUN/Creatinine Ratio: 17 (ref 9–23)
BUN: 12 mg/dL (ref 6–24)
Bilirubin Total: 0.5 mg/dL (ref 0.0–1.2)
CO2: 25 mmol/L (ref 20–29)
Calcium: 9.4 mg/dL (ref 8.7–10.2)
Chloride: 103 mmol/L (ref 96–106)
Creatinine, Ser: 0.71 mg/dL (ref 0.57–1.00)
GFR calc Af Amer: 121 mL/min/{1.73_m2} (ref >59)
GFR calc non Af Amer: 105 mL/min/{1.73_m2} (ref >59)
Globulin, Total: 2.7 g/dL (ref 1.5–4.5)
Glucose: 92 mg/dL (ref 65–99)
Potassium: 4 mmol/L (ref 3.5–5.2)
Sodium: 140 mmol/L (ref 134–144)
Total Protein: 7.2 g/dL (ref 6.0–8.5)

## 2019-09-09 LAB — CBC
Hematocrit: 38.9 % (ref 34.0–46.6)
Hemoglobin: 12.9 g/dL (ref 11.1–15.9)
MCH: 29 pg (ref 26.6–33.0)
MCHC: 33.2 g/dL (ref 31.5–35.7)
MCV: 87 fL (ref 79–97)
Platelets: 240 10*3/uL (ref 150–450)
RBC: 4.45 x10E6/uL (ref 3.77–5.28)
RDW: 13.2 % (ref 11.7–15.4)
WBC: 5.7 10*3/uL (ref 3.4–10.8)

## 2019-09-09 LAB — HEMOGLOBIN A1C
Est. average glucose Bld gHb Est-mCnc: 117 mg/dL
Hgb A1c MFr Bld: 5.7 % — ABNORMAL HIGH (ref 4.8–5.6)

## 2019-09-09 LAB — LIPID PANEL
Chol/HDL Ratio: 3.2 ratio (ref 0.0–4.4)
Cholesterol, Total: 183 mg/dL (ref 100–199)
HDL: 57 mg/dL (ref >39)
LDL Chol Calc (NIH): 109 mg/dL — ABNORMAL HIGH (ref 0–99)
Triglycerides: 93 mg/dL (ref 0–149)
VLDL Cholesterol Cal: 17 mg/dL (ref 5–40)

## 2019-09-09 LAB — TSH: TSH: 1.94 u[IU]/mL (ref 0.450–4.500)

## 2019-09-10 ENCOUNTER — Other Ambulatory Visit: Payer: Self-pay | Admitting: Nurse Practitioner

## 2019-09-10 DIAGNOSIS — Z1231 Encounter for screening mammogram for malignant neoplasm of breast: Secondary | ICD-10-CM

## 2019-09-30 ENCOUNTER — Ambulatory Visit
Admission: RE | Admit: 2019-09-30 | Discharge: 2019-09-30 | Disposition: A | Discharge status: Home / Self Care | Payer: No Typology Code available for payment source | Source: Ambulatory Visit | Attending: Nurse Practitioner | Admitting: Nurse Practitioner

## 2019-09-30 ENCOUNTER — Other Ambulatory Visit: Payer: Self-pay

## 2019-09-30 DIAGNOSIS — Z1231 Encounter for screening mammogram for malignant neoplasm of breast: Secondary | ICD-10-CM

## 2019-10-05 ENCOUNTER — Encounter: Payer: Self-pay | Admitting: *Deleted

## 2019-10-05 ENCOUNTER — Encounter: Payer: Self-pay | Admitting: Nurse Practitioner

## 2019-10-05 ENCOUNTER — Ambulatory Visit: Payer: Self-pay | Attending: Nurse Practitioner | Admitting: Nurse Practitioner

## 2019-10-05 ENCOUNTER — Other Ambulatory Visit: Payer: Self-pay

## 2019-10-05 VITALS — BP 106/66 | HR 58 | Temp 97.9°F | Ht 60.5 in | Wt 169.0 lb

## 2019-10-05 DIAGNOSIS — Z124 Encounter for screening for malignant neoplasm of cervix: Secondary | ICD-10-CM

## 2019-10-05 NOTE — Progress Notes (Signed)
Assessment & Plan:  Jillian Carlson was seen today for gynecologic exam.  Diagnoses and all orders for this visit:  Encounter for Papanicolaou smear for cervical cancer screening -     Cytology - PAP -     Cervicovaginal ancillary only    Patient has been counseled on age-appropriate routine health concerns for screening and prevention. These are reviewed and up-to-date. Referrals have been placed accordingly. Immunizations are up-to-date or declined.    Subjective:   Chief Complaint  Patient presents with  . Gynecologic Exam    Pt. is here for a pap smear.    HPI Jillian Carlson 44 y.o. female presents to office today for PAP.   Review of Systems  Constitutional: Negative for fever, malaise/fatigue and weight loss.  HENT: Negative.  Negative for nosebleeds.   Eyes: Negative.  Negative for blurred vision, double vision and photophobia.  Respiratory: Negative.  Negative for cough and shortness of breath.   Cardiovascular: Negative.  Negative for chest pain, palpitations and leg swelling.  Gastrointestinal: Negative.  Negative for heartburn, nausea and vomiting.  Musculoskeletal: Negative.  Negative for myalgias.  Neurological: Negative.  Negative for dizziness, focal weakness, seizures and headaches.  Psychiatric/Behavioral: Negative.  Negative for suicidal ideas.    Past Medical History:  Diagnosis Date  . Vaginal Pap smear, abnormal     Past Surgical History:  Procedure Laterality Date  . CESAREAN SECTION    . DILATION AND EVACUATION N/A 08/18/2013   Procedure: DILATATION AND EVACUATION;  Surgeon: Catalina Antigua, MD;  Location: [facility];  Service: Gynecology;  Laterality: N/A;  . TUBAL LIGATION      Family History  Problem Relation Age of Onset  . Diabetes Neg Hx     Social History Reviewed with no changes to be made today.   Outpatient Medications Prior to Visit  Medication Sig Dispense Refill  . Prenatal Vit-Fe Fumarate-FA (MULTIVITAMIN-PRENATAL) 27-0.8 MG TABS  tablet Take 1 tablet by mouth daily at 12 noon.     No facility-administered medications prior to visit.    Allergies  Allergen Reactions  . Apple        Objective:    BP 106/66 (BP Location: Left Arm, Patient Position: Sitting, Cuff Size: Large)   Pulse (!) 58   Temp 97.9 F (36.6 C) (Temporal)   Ht 5' 0.5" (1.537 m)   Wt 169 lb (76.7 kg)   SpO2 98%   BMI 32.46 kg/m  Wt Readings from Last 3 Encounters:  10/05/19 169 lb (76.7 kg)  07/07/14 163 lb 1.6 oz (74 kg)  03/24/14 163 lb 12.8 oz (74.3 kg)    Physical Exam Exam conducted with a chaperone present.  Constitutional:      Appearance: She is well-developed.  HENT:     Head: Normocephalic.  Cardiovascular:     Rate and Rhythm: Normal rate and regular rhythm.     Heart sounds: Normal heart sounds.  Pulmonary:     Effort: Pulmonary effort is normal.     Breath sounds: Normal breath sounds.  Abdominal:     General: Bowel sounds are normal.     Palpations: Abdomen is soft.     Hernia: There is no hernia in the left inguinal area.  Genitourinary:    Exam position: Lithotomy position.     Labia:        Right: No rash, tenderness, lesion or injury.        Left: No rash, tenderness, lesion or injury.  Vagina: Normal. No signs of injury and foreign body. No vaginal discharge, erythema, tenderness or bleeding.     Cervix: No cervical motion tenderness or friability.     Uterus: Not deviated and not enlarged.      Adnexa:        Right: No mass, tenderness or fullness.         Left: No mass, tenderness or fullness.       Rectum: Normal. No external hemorrhoid.  Lymphadenopathy:     Lower Body: No right inguinal adenopathy. No left inguinal adenopathy.  Skin:    General: Skin is warm and dry.  Neurological:     Mental Status: She is alert and oriented to person, place, and time.  Psychiatric:        Behavior: Behavior normal.        Thought Content: Thought content normal.        Judgment: Judgment normal.           Patient has been counseled extensively about nutrition and exercise as well as the importance of adherence with medications and regular follow-up. The patient was given clear instructions to go to ER or return to medical center if symptoms don't improve, worsen or new problems develop. The patient verbalized understanding.   Follow-up: Return if symptoms worsen or fail to improve.   Gildardo Pounds, FNP-BC [facility] [place], [place]   10/05/2019, 3:40 PM

## 2019-10-06 ENCOUNTER — Other Ambulatory Visit: Payer: Self-pay | Admitting: Nurse Practitioner

## 2019-10-06 LAB — CERVICOVAGINAL ANCILLARY ONLY
Bacterial Vaginitis (gardnerella): POSITIVE — AB
Candida Glabrata: NEGATIVE
Candida Vaginitis: NEGATIVE
Chlamydia: NEGATIVE
Comment: NEGATIVE
Comment: NEGATIVE
Comment: NEGATIVE
Comment: NEGATIVE
Comment: NEGATIVE
Comment: NORMAL
Neisseria Gonorrhea: NEGATIVE
Trichomonas: NEGATIVE

## 2019-10-06 LAB — CYTOLOGY - PAP
Comment: NEGATIVE
Diagnosis: NEGATIVE
High risk HPV: NEGATIVE

## 2019-10-06 MED ORDER — METRONIDAZOLE 500 MG PO TABS: 500.0000 mg | ORAL_TABLET | Freq: Two times a day (BID) | ORAL | 0 refills | Status: AC

## 2019-10-06 MED FILL — metroNIDAZOLE 500 MG TABS: 500 | 7 days supply | Qty: 14 | Fill #0

## 2019-10-07 ENCOUNTER — Telehealth: Payer: Self-pay | Admitting: Nurse Practitioner

## 2019-10-07 NOTE — Telephone Encounter (Signed)
Patient returned call and results were given out. Patient had no questions.

## 2019-10-23 MED FILL — metroNIDAZOLE 500 MG TABS: 500 | 7 days supply | Qty: 14 | Fill #0

## 2020-05-02 IMAGING — MG DIGITAL SCREENING BILAT W/ TOMO W/ CAD
8 series · 8 of 24 positions shown · non-contrast
Comparison: None.

CLINICAL DATA: Screening.

EXAM:
DIGITAL SCREENING BILATERAL MAMMOGRAM WITH TOMO AND CAD

[L MLO synth-2D]
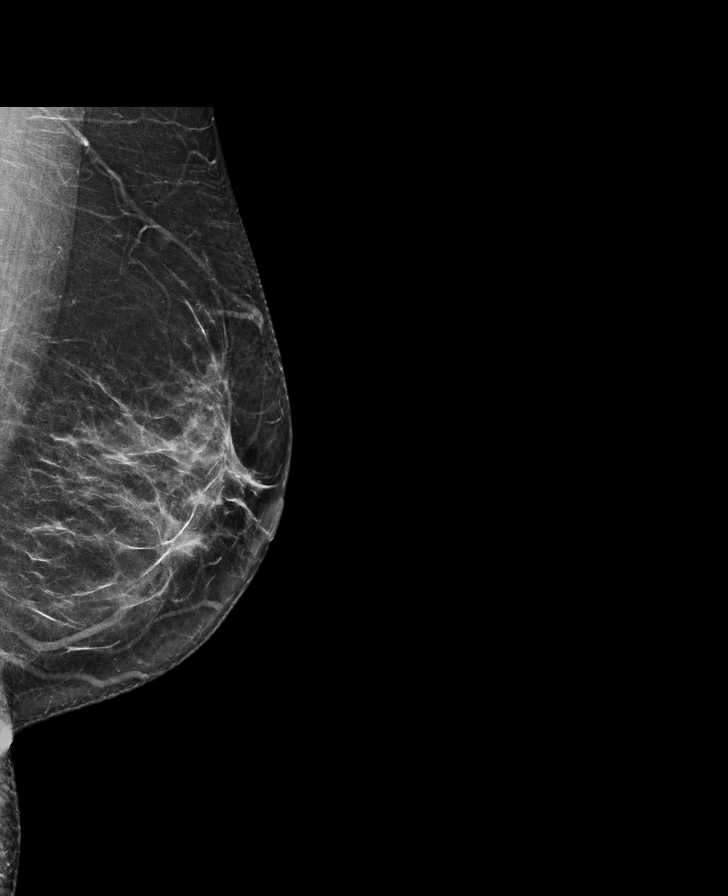

[R MLO synth-2D]
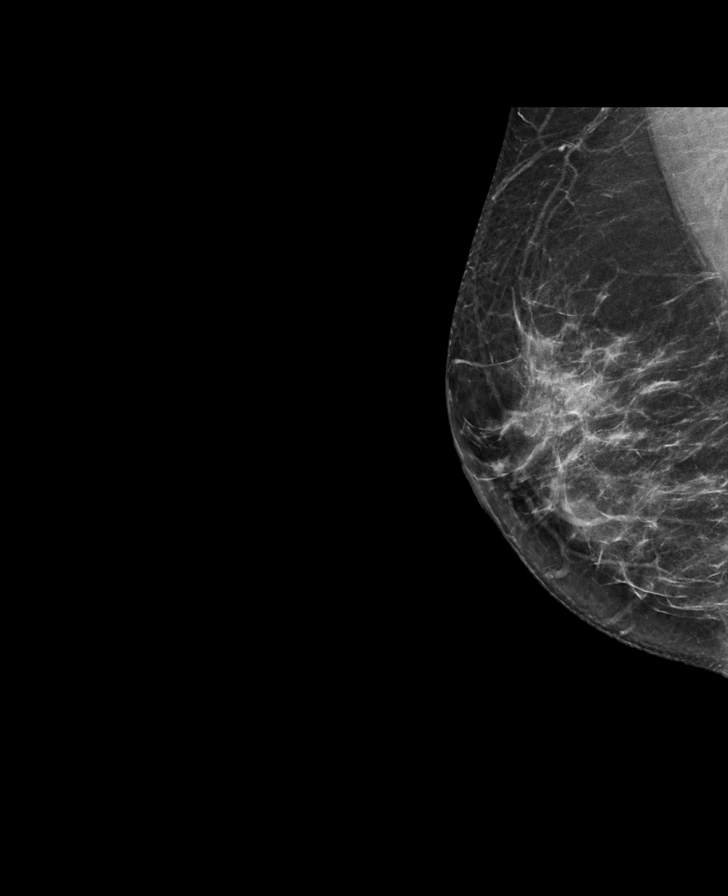

[R CC synth-2D]
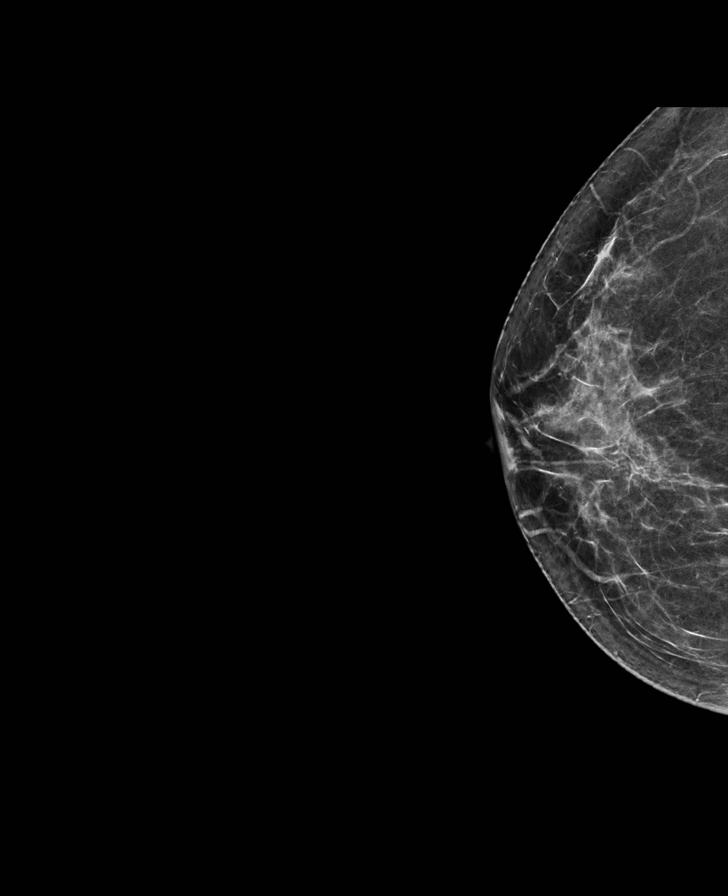

[L CC synth-2D]
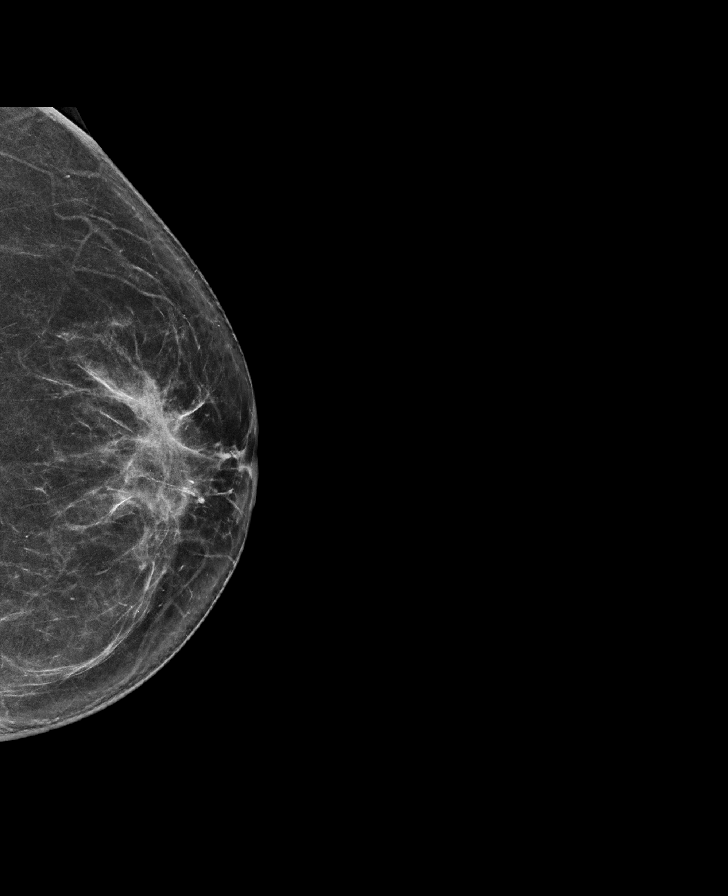

[L MLO tomo · tomo slice 37/72.0]
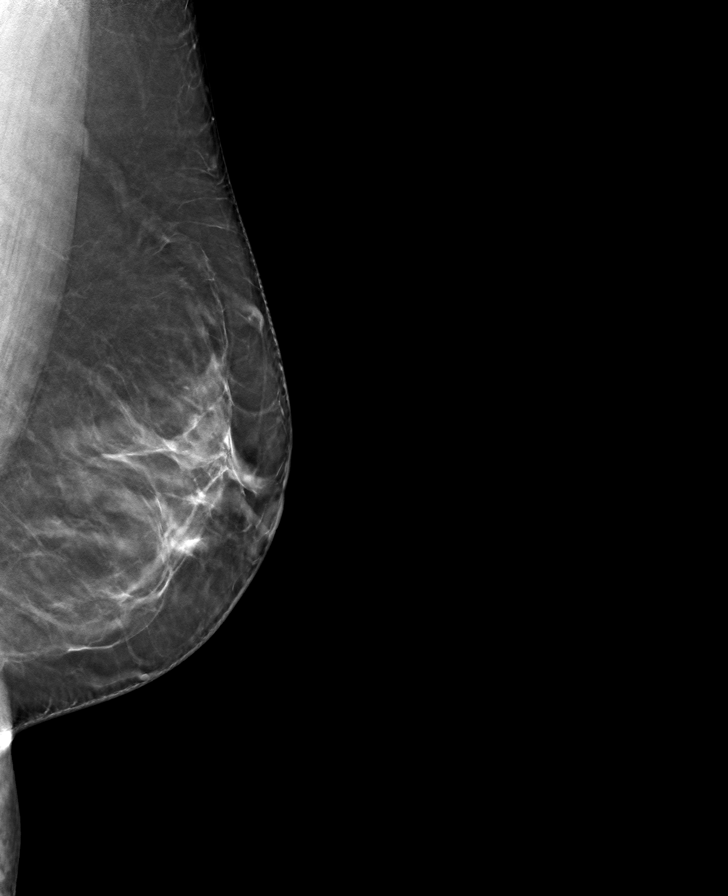

[R MLO tomo · tomo slice 36/71.0]
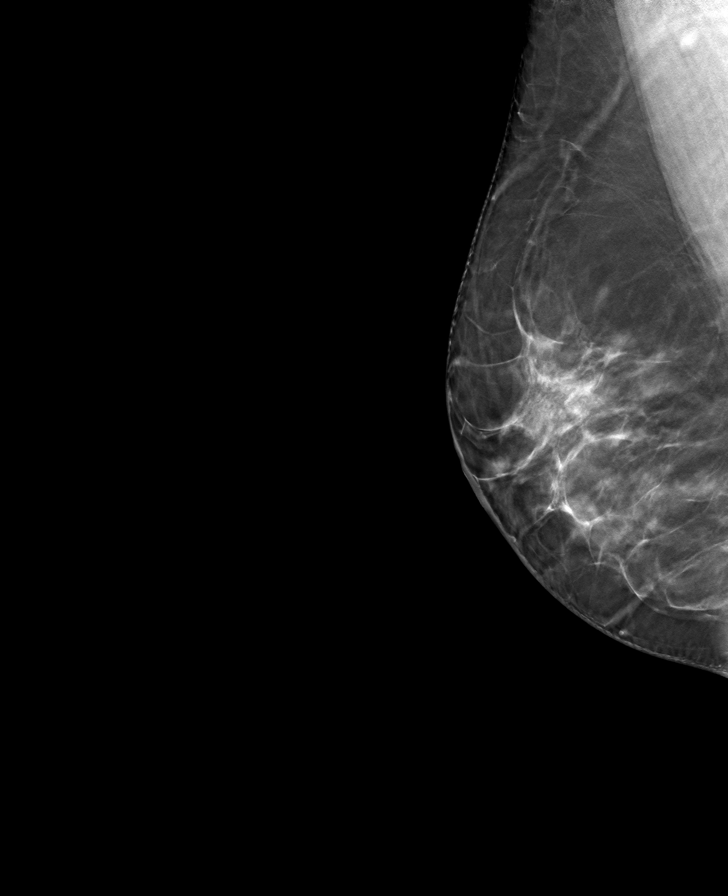

[R CC tomo · tomo slice 35/70.0]
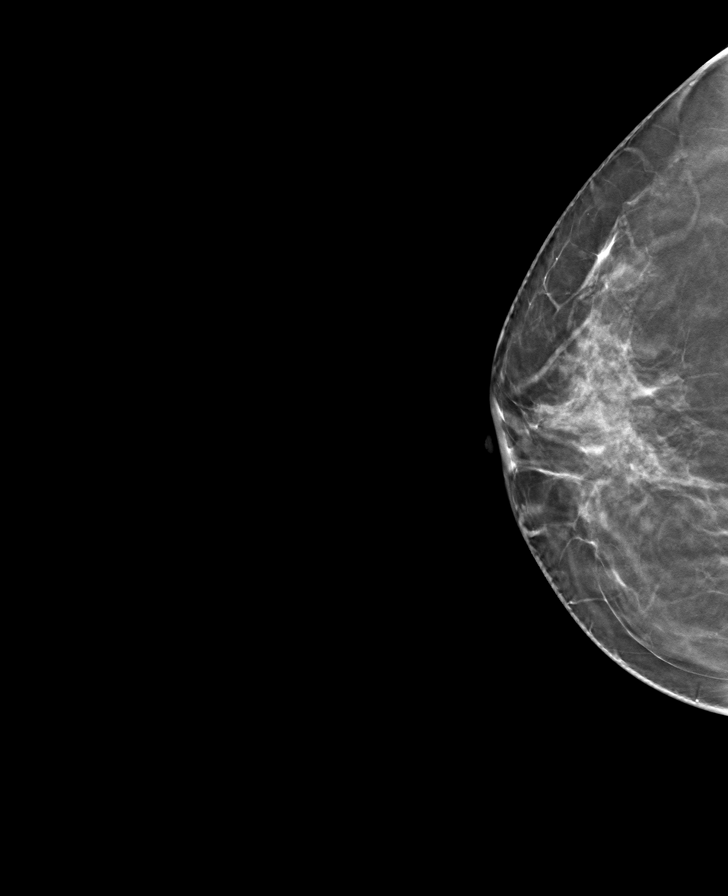

[L CC tomo · tomo slice 37/73.0]
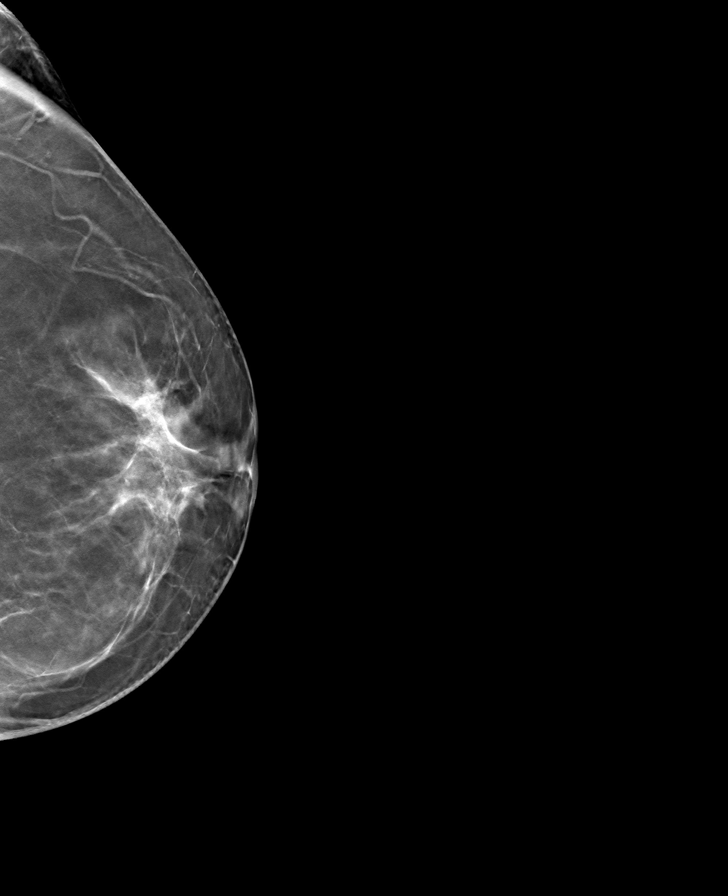

[8 of 24 positions shown; findings below may reference images not displayed]

ACR Breast Density Category c: The breast tissue is heterogeneously
dense, which may obscure small masses
FINDINGS: There are no findings suspicious for malignancy. Images were
processed with CAD.
IMPRESSION: No mammographic evidence of malignancy. A result letter of this
screening mammogram will be mailed directly to the patient.

RECOMMENDATION:
Screening mammogram in one year. (Code:EM-2-IHY)

BI-RADS CATEGORY  1: Negative.

## 2020-11-04 ENCOUNTER — Other Ambulatory Visit: Payer: Self-pay | Admitting: Nurse Practitioner
